# Patient Record
Sex: Female | Born: 1955 | Race: White | Hispanic: No | Smoking: Never smoker
Health system: Southern US, Community
[De-identification: ages and names within clinical notes are randomized; demographics above are authoritative.]

## PROBLEM LIST (undated history)

## (undated) DIAGNOSIS — K219 Gastro-esophageal reflux disease without esophagitis: Secondary | ICD-10-CM

## (undated) DIAGNOSIS — J189 Pneumonia, unspecified organism: Secondary | ICD-10-CM

## (undated) DIAGNOSIS — Q893 Situs inversus: Secondary | ICD-10-CM

## (undated) DIAGNOSIS — G709 Myoneural disorder, unspecified: Secondary | ICD-10-CM

## (undated) DIAGNOSIS — F419 Anxiety disorder, unspecified: Secondary | ICD-10-CM

## (undated) DIAGNOSIS — M1990 Unspecified osteoarthritis, unspecified site: Secondary | ICD-10-CM

## (undated) DIAGNOSIS — I10 Essential (primary) hypertension: Secondary | ICD-10-CM

## (undated) HISTORY — PX: NASAL SINUS SURGERY: SHX719

## (undated) HISTORY — PX: CARDIAC CATHETERIZATION: SHX172

---

## 2003-01-22 ENCOUNTER — Ambulatory Visit (HOSPITAL_COMMUNITY): Admission: RE | Admit: 2003-01-22 | Discharge: 2003-01-22 | Payer: Self-pay | Admitting: Neurosurgery

## 2003-01-22 IMAGING — XA IR FLUORO GUIDE NDL PLMT / BX
1 series · 13 of 16 positions shown · non-contrast
Comparison: none

CLINICAL DATA: Patient with low back pain.  Suspicion of diskitis at L3-4. 
 FLUOROSCOPICALLY GUIDED NEEDLE PLACEMENT FOR DISK ASPIRATION AT L3-4   
 Following a full explanation of the procedure along with the potentially associated complications, an informed witnessed consent was obtained.  
 The patient was laid prone on the fluoroscopic table. 
 Using biplane intermittent fluoroscopy, the L3-4 disk space was identified.   
 The overlying skin was prepped and draped in the usual sterile fashion. 
 Using a right posterolateral approach, in a coaxial manner through an 18 gauge spinal needle, two passes were made with a 20 cm 22 gauge curved Chiba needle.  Two separate needles were utilized. 
 Having confirmed the position of tip within the disk space, aspirations were performed and sent for analysis.  There were no acute complications and the patient tolerated the procedure well.  
 Medications utilized:  Versed 2 mg IV, Fentanyl 50 mg IV. 
 IMPRESSION
 Status post fluoroscopic needle placement for disk aspiration at L3-4 for suspected diskitis.  
 Cc:  Dr. BRITHANY

[Series 1: run · 13 of 16 slices shown]
[im 1/16]
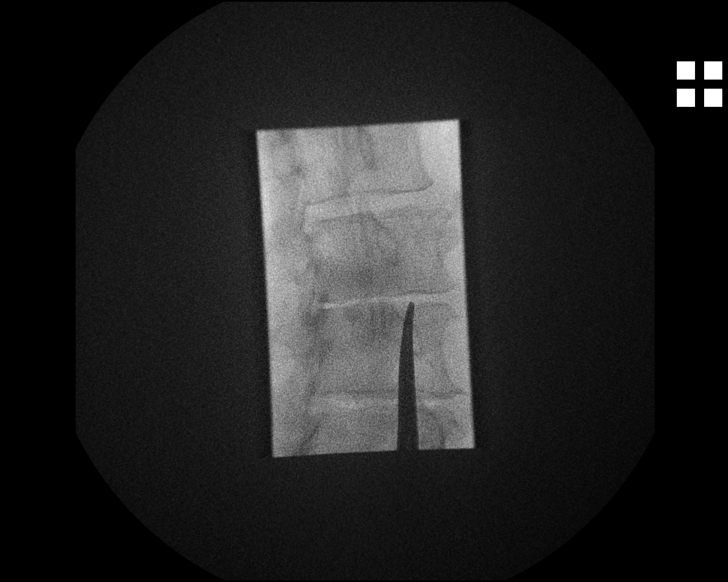
[im 2/16]
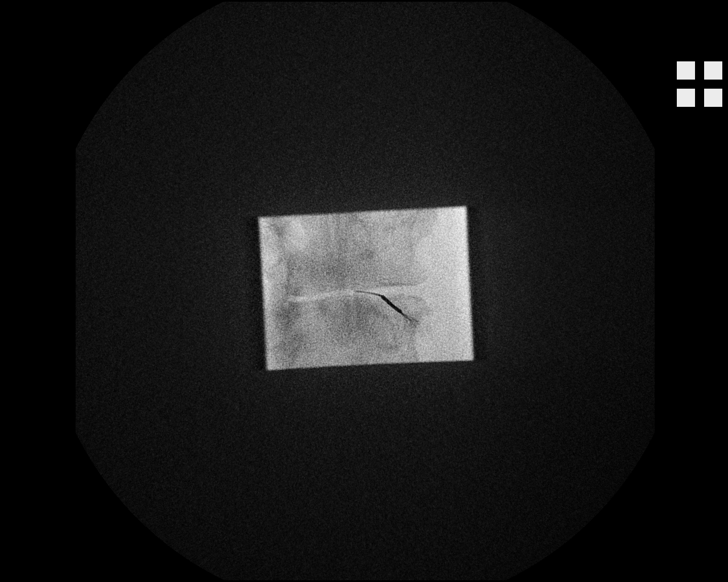
[im 4/16]
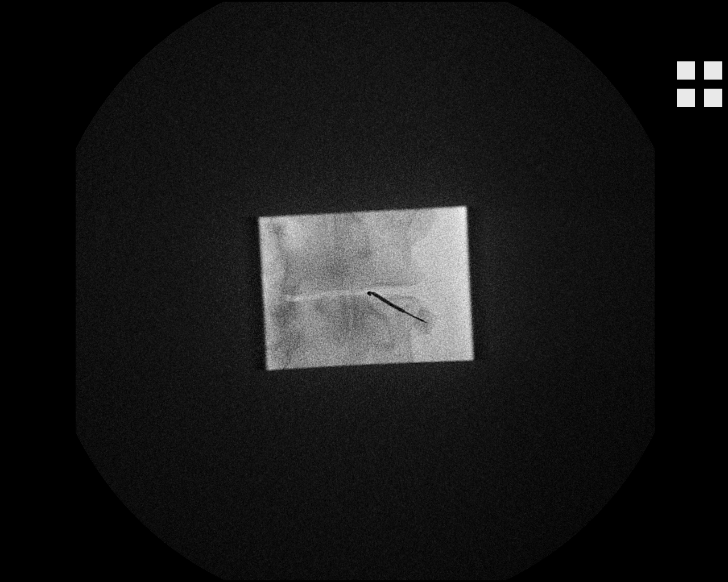
[im 5/16]
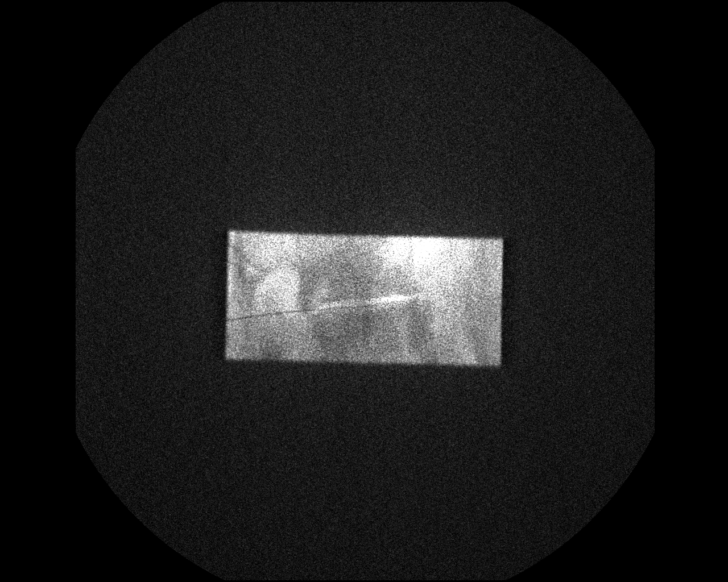
[im 6/16]
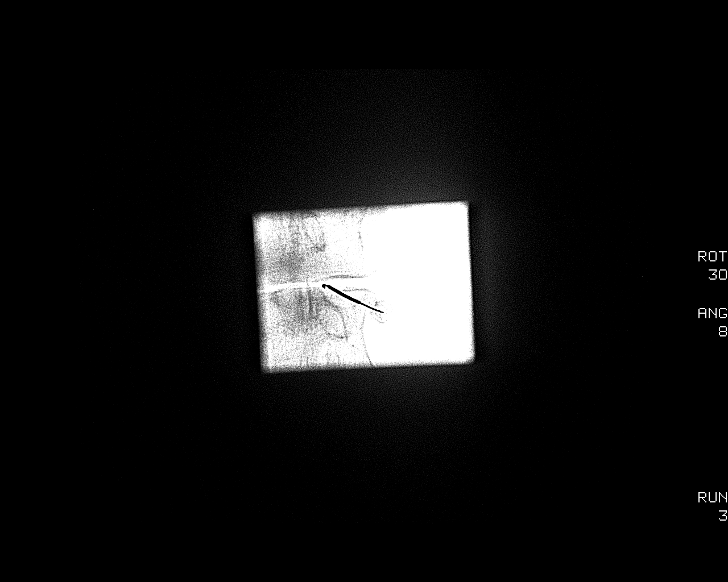
[im 7/16]
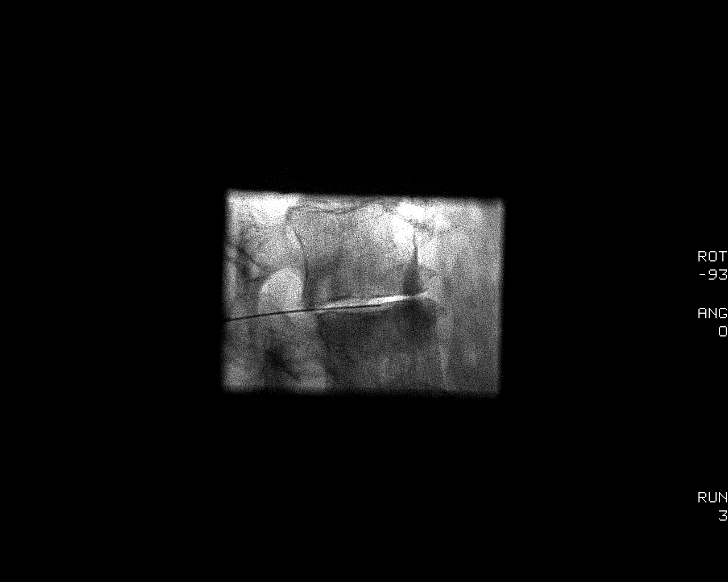
[im 9/16]
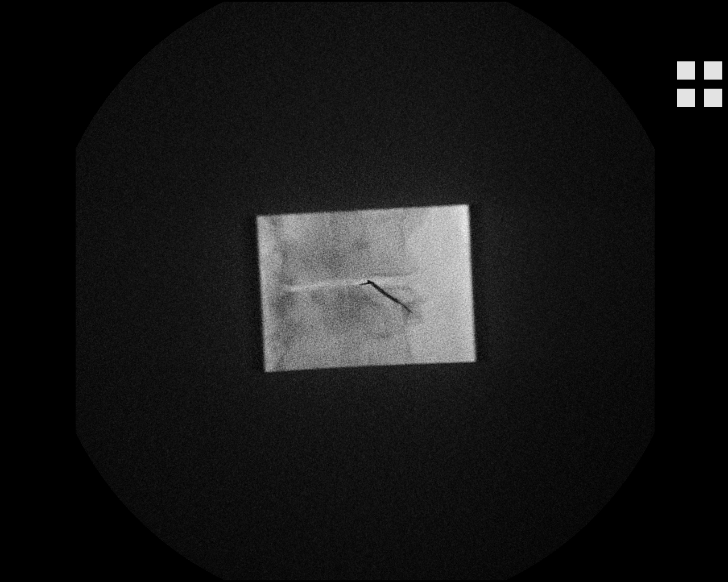
[im 10/16]
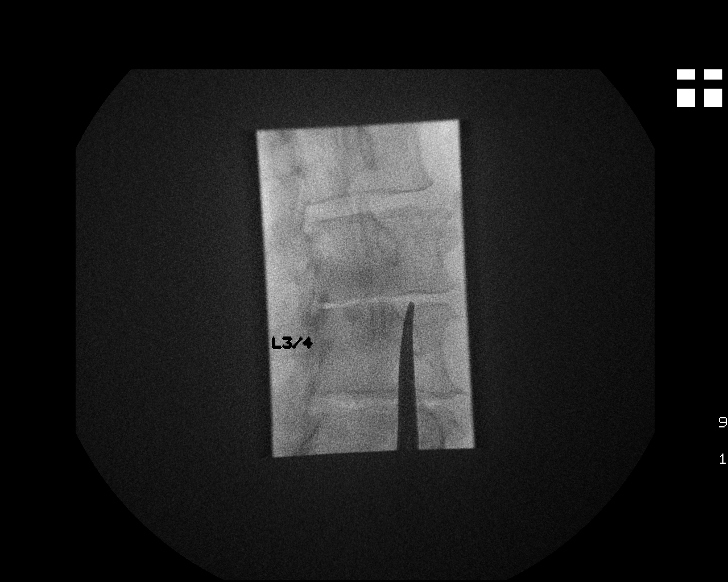
[im 11/16]
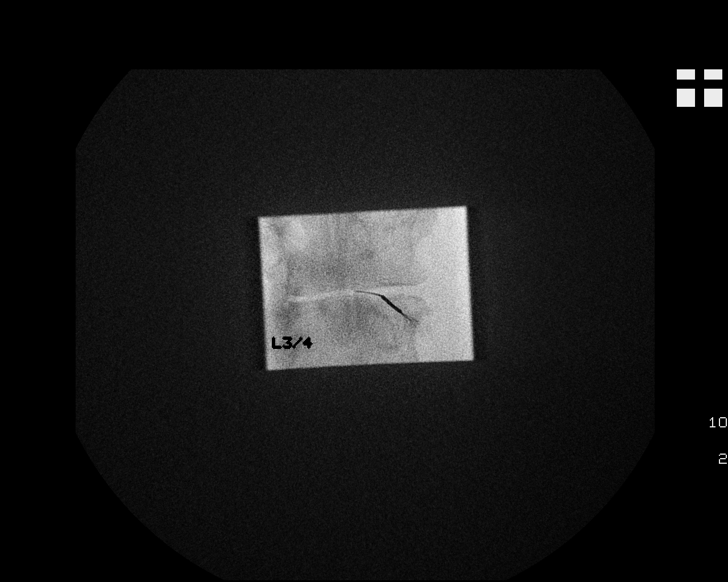
[im 12/16  full-range]
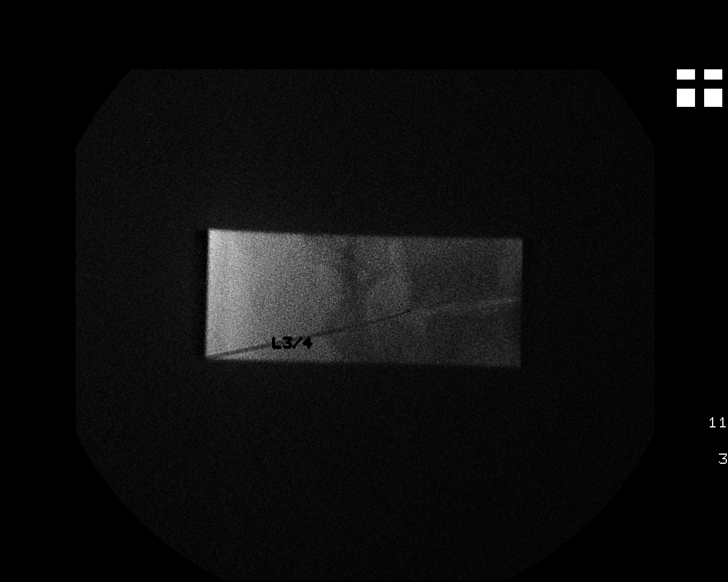
[im 13/16]
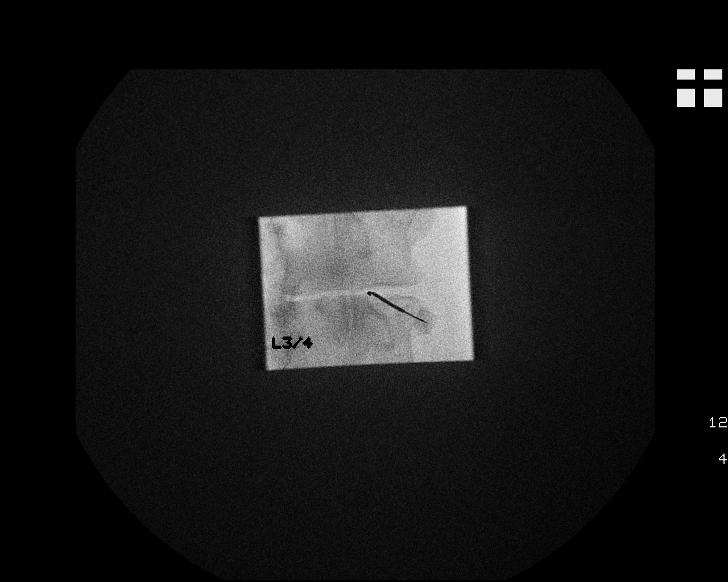
[im 15/16]
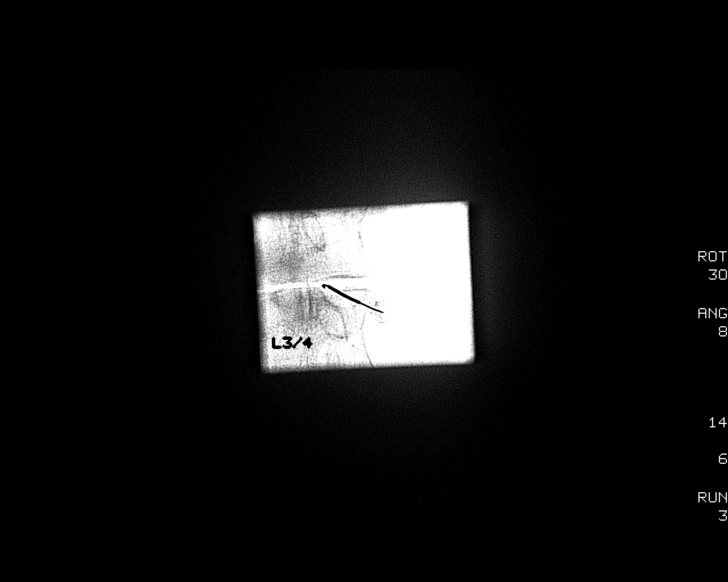
[im 16/16]
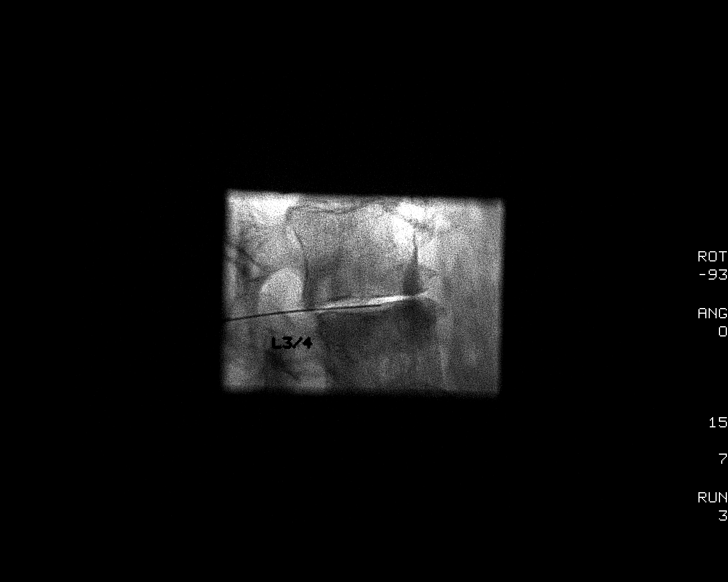

[13 of 16 positions shown; findings below may reference images not displayed]

## 2003-05-23 ENCOUNTER — Ambulatory Visit (HOSPITAL_COMMUNITY): Admission: RE | Admit: 2003-05-23 | Discharge: 2003-05-23 | Payer: Self-pay | Admitting: Neurosurgery

## 2003-05-23 IMAGING — XA IR FLUORO GUIDE NDL PLMT / BX
1 series · 13 of 19 positions shown · non-contrast
Comparison: none

CLINICAL DATA: Back pain.  Advanced degenerative disk disease at L3-4.  Recent positive TB test.  She has had a previous negative disk space aspiration at L3-4.
PERCUTANEOUS DISK AND BONE CORE BIOPSY AT L3-4 UNDER FLUOROSCOPY
TECHNIQUE: An appropriate skin entry site was determined under fluoroscopy.  The site was prepped with Betadine, draped in the usual sterile fashion, and infiltrated locally with 0.25% Bupivacaine.  Intravenous fentanyl and Versed were administered as conscious sedation during continues cardiorespiratory monitoring by radiology RN.  A 15 cm 13 gauge AZNAR bone biopsy needle was advanced to the margin of the L3-4 interspace using a right posterolateral approach.  The Cook needle was exchanged over a pin for the Kyphx bone biopsy trocar, which was advanced on into the interspace.  The fluted stylet was removed and the recovered disk and end-plate fragments were placed in saline.  Through the trochar, the coaxial bone biopsy device was advanced in three separate passes through both the interspace and end-plate.  Samples were placed in saline and sent for AFB and routine cultures, sensitivity, and gram stain, as well as AFB stain.  The guiding trocar was removed.  The patient tolerated the procedure well and there were no immediate complications.  
IMPRESSION
Technically successful percutaneous core biopsy of the L3-4 interspace and adjacent end-plates.

[Series 1: run · 13 of 19 slices shown]
[im 1/19]
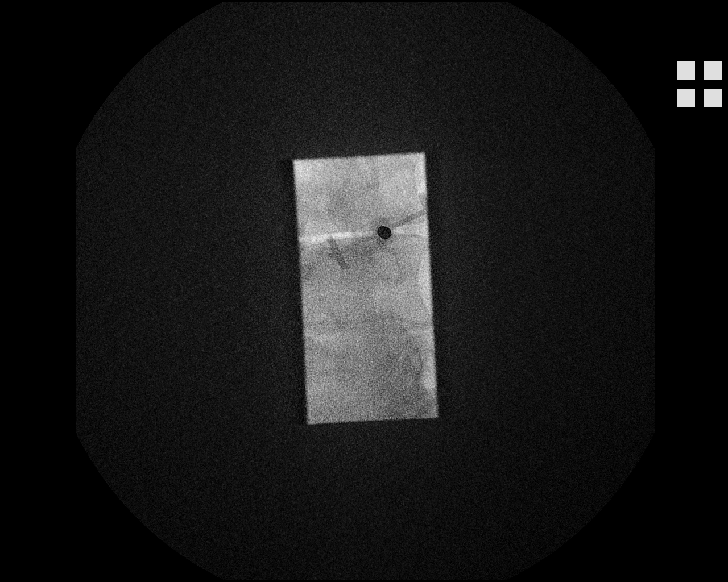
[im 3/19]
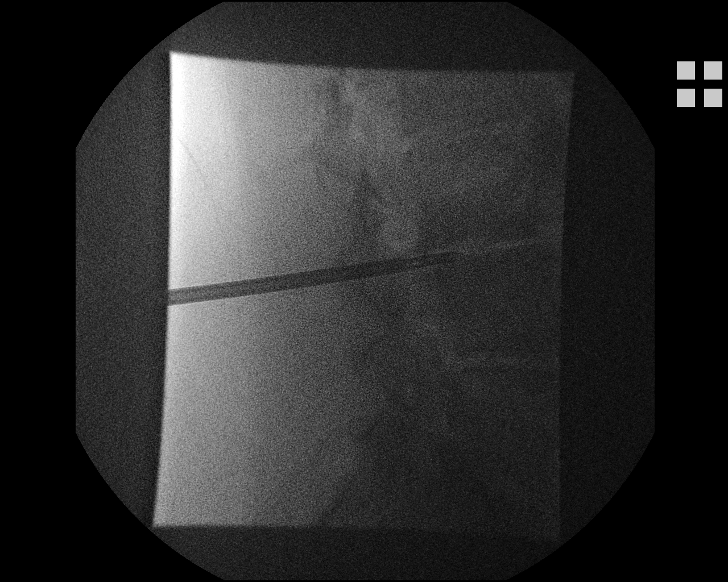
[im 4/19]
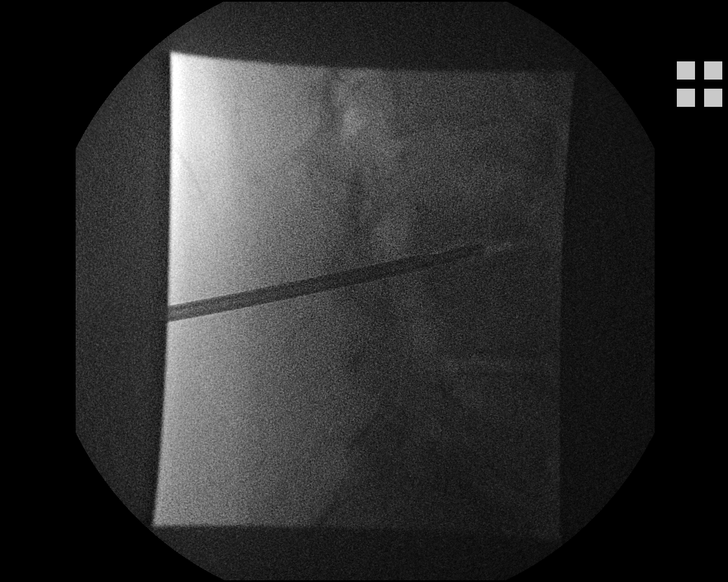
[im 6/19]
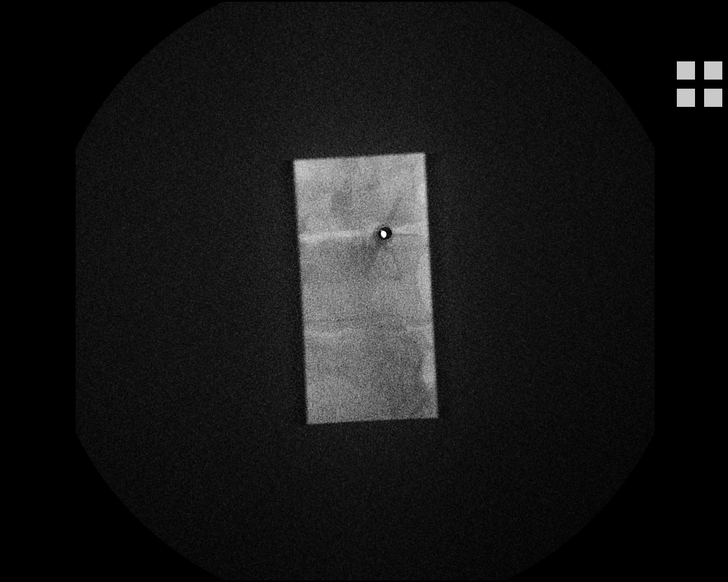
[im 7/19]
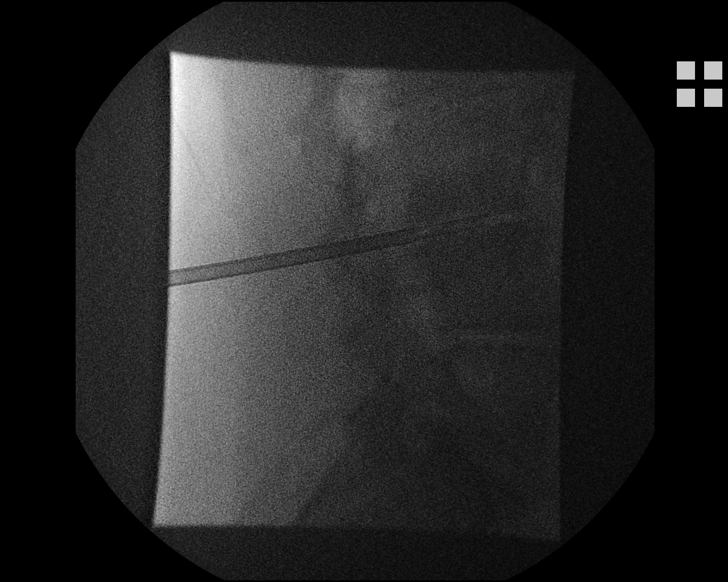
[im 9/19]
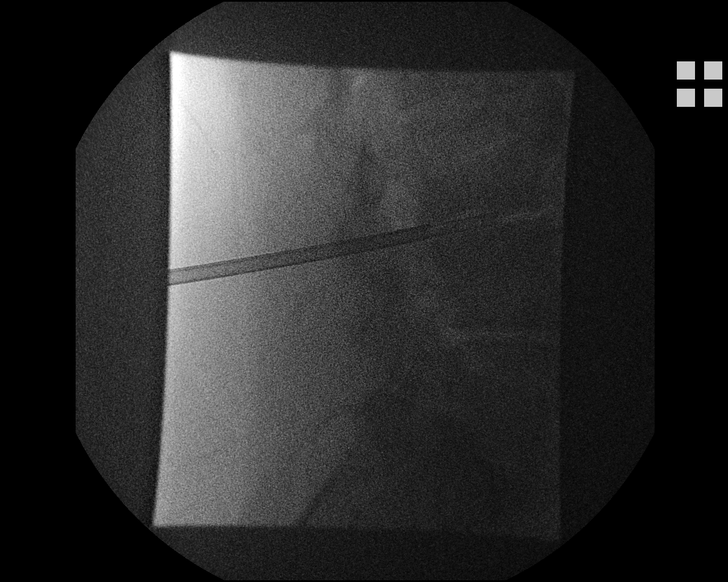
[im 10/19]
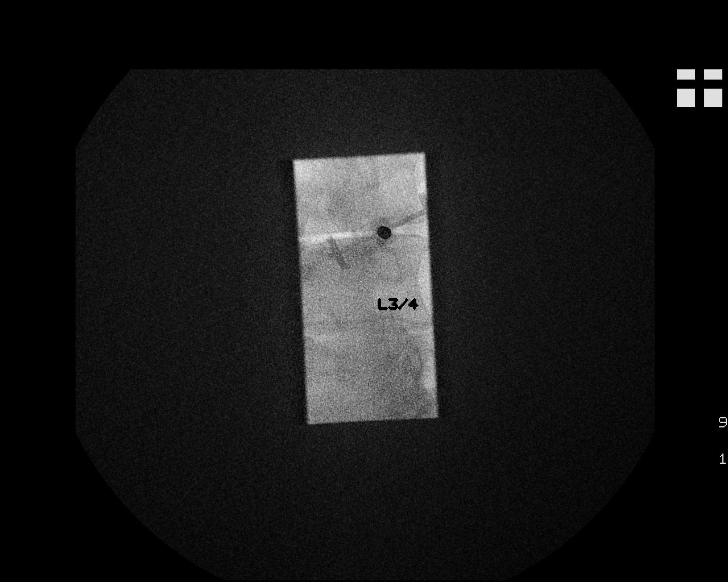
[im 11/19]
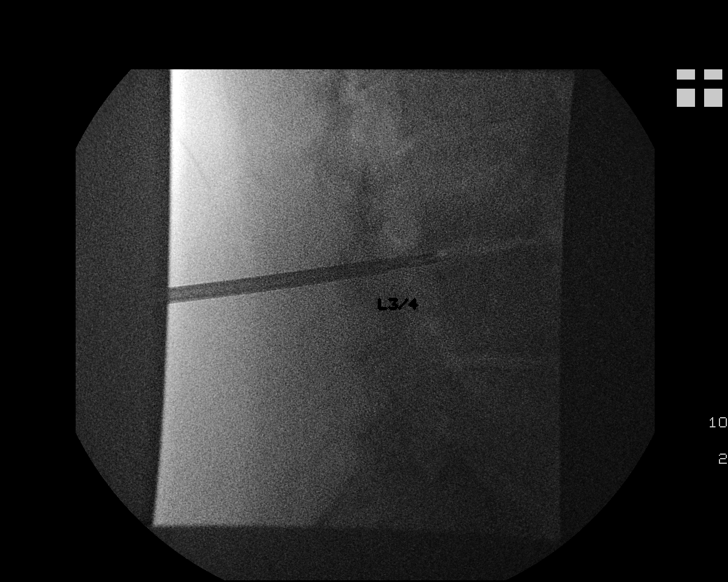
[im 13/19]
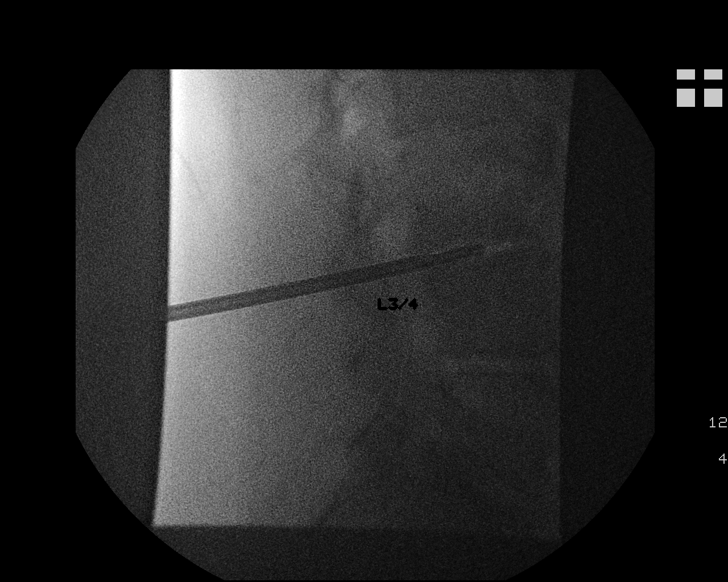
[im 14/19]
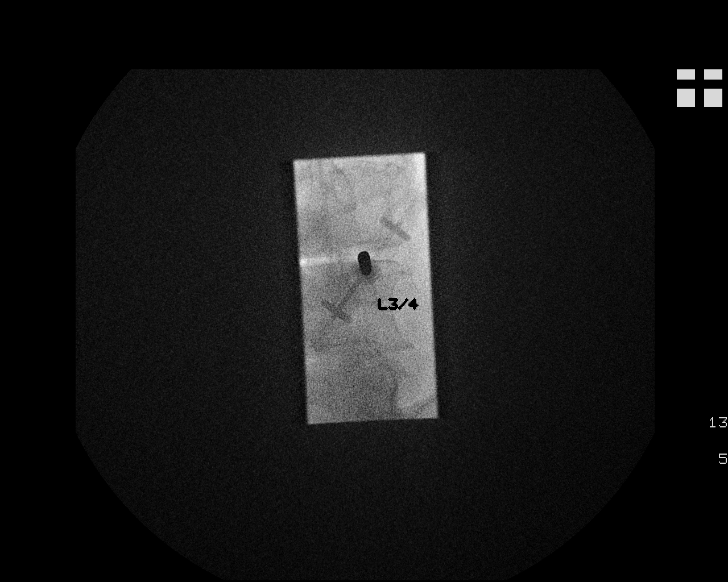
[im 16/19]
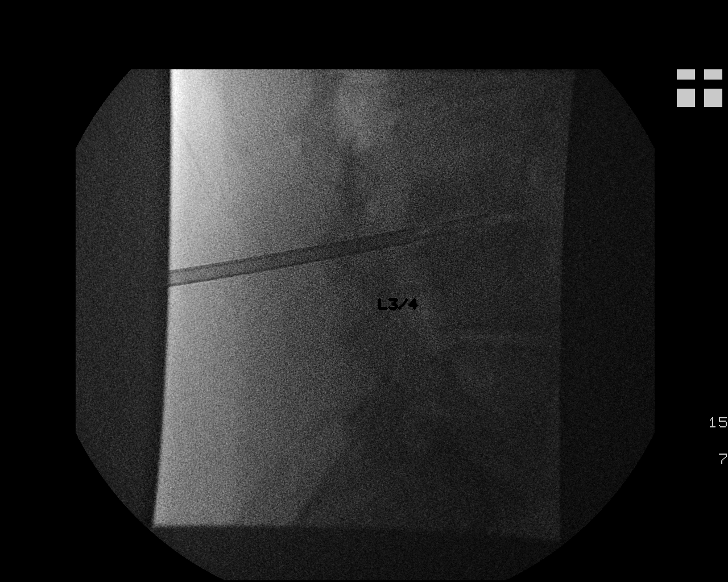
[im 17/19]
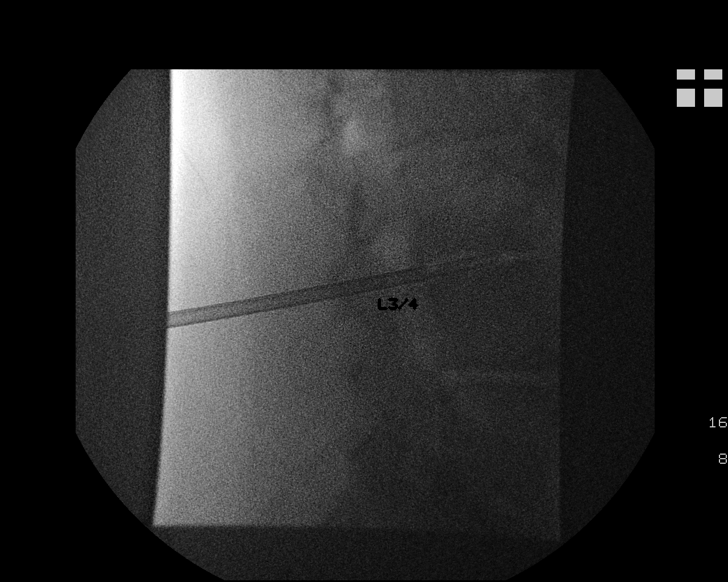
[im 19/19]
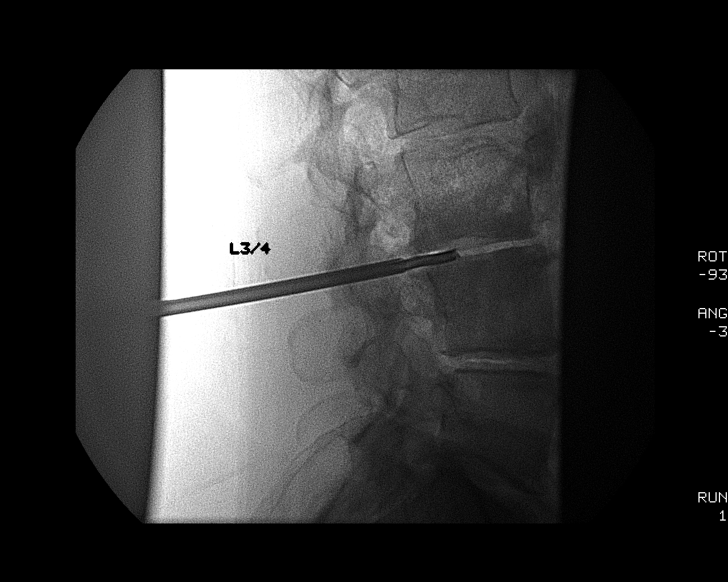

[13 of 19 positions shown; findings below may reference images not displayed]

## 2003-06-04 ENCOUNTER — Encounter: Admission: RE | Admit: 2003-06-04 | Discharge: 2003-06-04 | Payer: Self-pay | Admitting: Infectious Diseases

## 2003-07-18 ENCOUNTER — Encounter: Admission: RE | Admit: 2003-07-18 | Discharge: 2003-07-18 | Payer: Self-pay | Admitting: Infectious Diseases

## 2003-08-18 ENCOUNTER — Encounter: Admission: RE | Admit: 2003-08-18 | Discharge: 2003-08-18 | Payer: Self-pay | Admitting: Infectious Diseases

## 2006-03-16 ENCOUNTER — Inpatient Hospital Stay (HOSPITAL_COMMUNITY): Admission: EM | Admit: 2006-03-16 | Discharge: 2006-03-17 | Payer: Self-pay | Admitting: Emergency Medicine

## 2006-03-16 ENCOUNTER — Encounter: Payer: Self-pay | Admitting: Cardiology

## 2006-03-16 IMAGING — CR DG CHEST 2V
2 series · 2 of 2 positions shown · non-contrast
Comparison: None.

CLINICAL DATA: MI. Post-cardiac catheterization.

CHEST - 2 VIEW  [DATE]:

[view not recorded (1 of 2)]
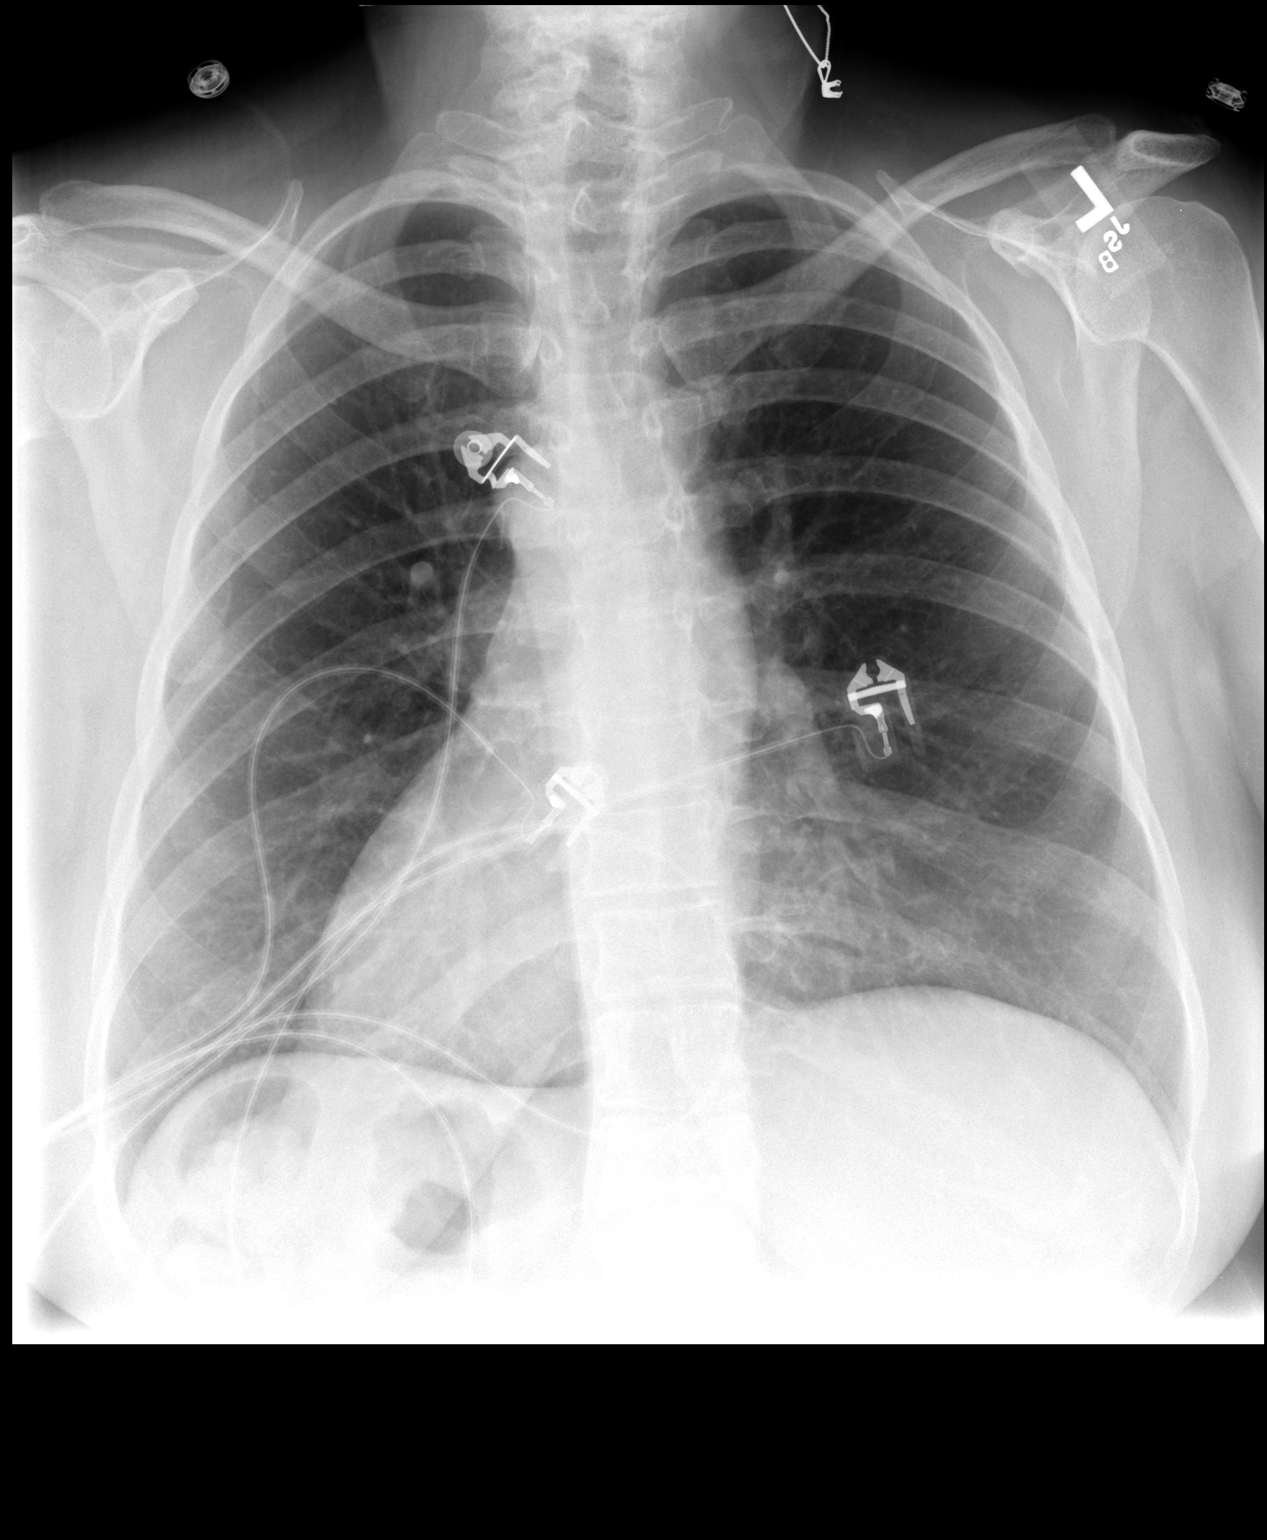

[view not recorded (2 of 2)]
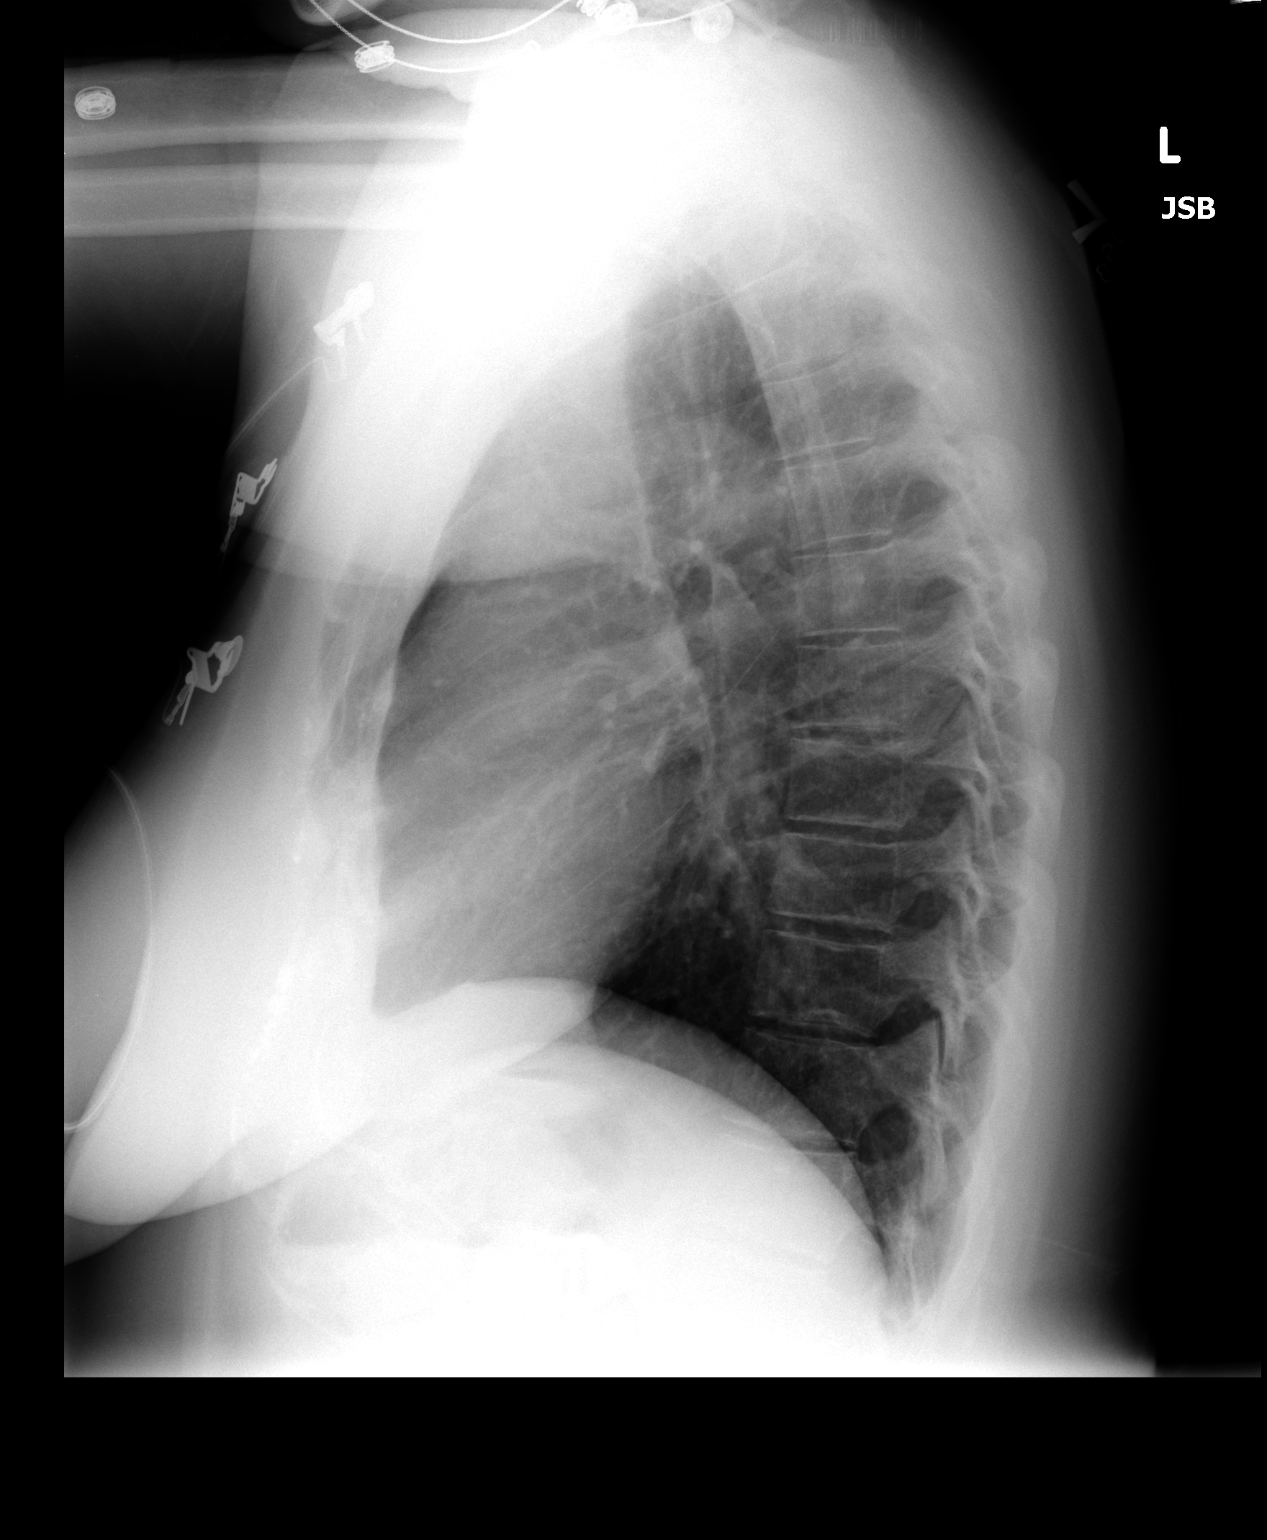

[2 of 2 positions shown; findings below may reference images not displayed]

FINDINGS: Situs inversus, with the heart on the right, the descending thoracic
aorta on the right, and the liver on the left. Heart size normal. Pulmonary
parenchyma clear. No pleural effusions. Visualized bony thorax intact.
IMPRESSION: Situs inversus. No acute cardiopulmonary disease.

## 2010-03-12 ENCOUNTER — Ambulatory Visit (INDEPENDENT_AMBULATORY_CARE_PROVIDER_SITE_OTHER): Payer: BC Managed Care – PPO | Admitting: Cardiology

## 2010-03-12 DIAGNOSIS — I10 Essential (primary) hypertension: Secondary | ICD-10-CM

## 2010-12-16 ENCOUNTER — Encounter (HOSPITAL_COMMUNITY): Payer: Self-pay | Admitting: Pharmacy Technician

## 2010-12-17 ENCOUNTER — Other Ambulatory Visit: Payer: Self-pay | Admitting: Urology

## 2010-12-22 ENCOUNTER — Encounter (HOSPITAL_COMMUNITY)
Admission: RE | Admit: 2010-12-22 | Discharge: 2010-12-22 | Disposition: A | Discharge status: Home / Self Care | Payer: BC Managed Care – PPO | Source: Ambulatory Visit | Attending: Obstetrics and Gynecology | Admitting: Obstetrics and Gynecology

## 2010-12-22 ENCOUNTER — Encounter (HOSPITAL_COMMUNITY): Payer: Self-pay

## 2010-12-22 HISTORY — DX: Myoneural disorder, unspecified: G70.9

## 2010-12-22 HISTORY — DX: Essential (primary) hypertension: I10

## 2010-12-22 LAB — BASIC METABOLIC PANEL
BUN: 20 mg/dL (ref 6–23)
CO2: 25 mEq/L (ref 19–32)
Calcium: 10.3 mg/dL (ref 8.4–10.5)
Chloride: 102 mEq/L (ref 96–112)
Creatinine, Ser: 0.72 mg/dL (ref 0.50–1.10)
GFR calc Af Amer: >90 mL/min (ref >90)
GFR calc non Af Amer: >90 mL/min (ref >90)
Glucose, Bld: 89 mg/dL (ref 70–99)
Potassium: 3.7 mEq/L (ref 3.5–5.1)
Sodium: 137 mEq/L (ref 135–145)

## 2010-12-22 LAB — CBC
HCT: 41.7 % (ref 36.0–46.0)
Hemoglobin: 14.4 g/dL (ref 12.0–15.0)
MCH: 29.4 pg (ref 26.0–34.0)
MCHC: 34.5 g/dL (ref 30.0–36.0)
MCV: 85.1 fL (ref 78.0–100.0)
Platelets: 273 10*3/uL (ref 150–400)
RBC: 4.9 MIL/uL (ref 3.87–5.11)
RDW: 14.7 % (ref 11.5–15.5)
WBC: 9.3 10*3/uL (ref 4.0–10.5)

## 2010-12-22 LAB — SURGICAL PCR SCREEN
MRSA, PCR: NEGATIVE
Staphylococcus aureus: NEGATIVE

## 2010-12-22 NOTE — Pre-Procedure Instructions (Signed)
Patient states her heart is on the right side of her chest and all her organs are reversed from normal position.

## 2010-12-22 NOTE — Patient Instructions (Signed)
YOUR PROCEDURE IS SCHEDULED ON:01/04/11  ENTER THROUGH THE MAIN ENTRANCE OF [facility] AT:6am  USE DESK PHONE AND DIAL [id number] TO INFORM us OF YOUR ARRIVAL  CALL [phone] IF YOU HAVE ANY QUESTIONS OR PROBLEMS PRIOR TO YOUR ARRIVAL.  REMEMBER: DO NOT EAT OR DRINK AFTER MIDNIGHT :Monday  SPECIAL INSTRUCTIONS:   YOU MAY BRUSH YOUR TEETH THE MORNING OF SURGERY   TAKE THESE MEDICINES THE DAY OF SURGERY WITH SIP OF WATER: BP pill   DO NOT WEAR JEWELRY, EYE MAKEUP, LIPSTICK OR DARK FINGERNAIL POLISH DO NOT WEAR LOTIONS OR DEODORANT DO NOT SHAVE FOR 48 HOURS PRIOR TO SURGERY  YOU WILL NOT BE ALLOWED TO DRIVE YOURSELF HOME.  NAME OF DRIVER: Hailey Bates

## 2010-12-28 NOTE — H&P (Signed)
Hailey Bates is an 55 y.o. female.   Chief Complaint: "my bladder has dropped"  HPI: DWF G2P1 with uterine decensus, and cystocele, symptomatic, for repair.   Past Medical History  Diagnosis Date  . Hypertension   . Neuromuscular disorder     sciatica  Situs inversus  Past Surgical History  Procedure Date  . Cardiac catheterization   . Nasal sinus surgery     No family history on file. Social History:  reports that she has never smoked. She does not have any smokeless tobacco history on file. She reports that she drinks alcohol. She reports that she does not use illicit drugs.  Allergies: No Known Allergies  No current facility-administered medications on file as of .   Medications Prior to Admission  Medication Sig Dispense Refill  . amitriptyline (ELAVIL) 25 MG tablet Take 25 mg by mouth at bedtime.        Marland Kitchen amLODipine-olmesartan (AZOR) 5-20 MG per tablet Take 1 tablet by mouth daily.        . cyclobenzaprine (FLEXERIL) 10 MG tablet Take 10 mg by mouth daily as needed. Takes for muscle spasms       . HYDROcodone-acetaminophen (NORCO) 5-325 MG per tablet Take 1 tablet by mouth every 6 (six) hours as needed. Takes for pain        . zolpidem (AMBIEN) 10 MG tablet Take 10 mg by mouth at bedtime.        . naproxen (NAPROSYN) 500 MG tablet Take 500 mg by mouth daily.          No results found for this or any previous visit (from the past 48 hour(s)). No results found.  Review of Systems  All other systems reviewed and are negative.    There were no vitals taken for this visit. Physical Exam  Constitutional: She is oriented to person, place, and time. She appears well-developed and well-nourished.  HENT:  Head: Normocephalic and atraumatic.  Eyes: Conjunctivae and EOM are normal. Pupils are equal, round, and reactive to light.  Neck: Normal range of motion.  Cardiovascular: Normal rate and regular rhythm.   Respiratory: Effort normal and breath sounds normal.  GI:  Soft. Bowel sounds are normal.  Genitourinary:       Grade 2 - 3 cystocele, min rectocele; uterus desends to 3 cm inside introitus.  Uterus normal size, NT.  Adnexa neg.  Musculoskeletal: Normal range of motion.  Neurological: She is alert and oriented to person, place, and time.  Skin: Skin is warm and dry.  Psychiatric: She has a normal mood and affect.     Assessment/Plan Pelvic prolapse.  For TVH by Dr. Tresa Res, and pelvic reconstruction by Dr. Lorin Picket McDiarmid.  Moe Brier P 12/28/2010, 2:35 PM

## 2011-01-02 NOTE — H&P (Signed)
History of Present Illness   I was consulted by Dr. Arline Asp Romine regarding Ms. Hailey Bates's voiding dysfunction and pelvic organ prolapse. She is scheduled for a hysterectomy and prolapse repair on December 4.   Ms. Santarelli is 55 years of age. She sometimes leaks with coughing and sneezing but not bending and lifting. She rarely leaks with urgency. She denies enuresis. She wears one liner a day for safety.  She voids every 2 hours but drinks a lot of fluids. She gets up once or twice a night. She reports a good flow.   She denies a history of kidney stones, previous GU surgery and urinary tract infections.  She can feel vaginal bulging. It feels like she is wearing a tampon. She has not reduced it. She has not had a hysterectomy. She has no neurologic risk factors or symptoms. Her bowel movements are normal. Her symptoms have not been medically treated.    Past Medical History Problems  1. History of  Arthritis V13.4 2. History of  Hypertension 401.9  Surgical History Problems  1. History of  Heart Surgery 2. History of  Heart Surgery 3. History of  Sinus Surgery 4. History of  Sinus Surgery  Current Meds 1. Amitriptyline HCl 25 MG Oral Tablet; Therapy: (Recorded:16Nov2012) to 2. Azor 5-20 MG Oral Tablet; Therapy: 15Nov2012 to 3. Cyclobenzaprine HCl 10 MG Oral Tablet; Therapy: (Recorded:16Nov2012) to 4. Oxycodone-Acetaminophen 5-325 MG Oral Tablet; Therapy: (Recorded:16Nov2012) to 5. Zolpidem Tartrate 10 MG Oral Tablet; Therapy: (Recorded:16Nov2012) to  Allergies Medication  1. No Known Drug Allergies  Family History Problems  1. Paternal history of  Acute Myocardial Infarction V17.3 2. Fraternal history of  Bladder Cancer V16.52 3. Family history of  Death In The Family Father 4. Paternal history of  Death In The Family Father father passed @ age 84heart attack 5. Family history of  Death In The Family Mother 6. Maternal history of  Death In The Family  Mother mother passed @ age 84cancer 7. Paternal history of  Diabetes Mellitus V18.0 8. Family history of  Family Health Status Number Of Children 1 daughter 27. Paternal history of  Family Health Status Number Of Children 1 daughter 67. Paternal history of  Hypertension V17.49 11. Maternal history of  Hypertension V17.49 12. Maternal history of  Neoplasm Of The Brain  Social History Problems    Alcohol Use occasional wine   Caffeine Use 3 glasses daily   Marital History - Divorced V61.03   Never A Smoker   Occupation: Geologist, engineering  Review of Systems Constitutional, skin, eye, otolaryngeal, hematologic/lymphatic, cardiovascular, pulmonary, endocrine, musculoskeletal, gastrointestinal, neurological and psychiatric system(s) were reviewed and pertinent findings if present are noted.  Genitourinary: incontinence.    Vitals Vital Signs [Data Includes: Last 1 Day]  16Nov2012 09:58AM  BMI Calculated: 27.32 BSA Calculated: 1.78 Height: 5 ft 4 in Weight: 160 lb  Blood Pressure: 131 / 78 Temperature: 98.1 F Heart Rate: 91  Physical Exam Constitutional: Well nourished and well developed . No acute distress.  ENT:. The ears and nose are normal in appearance.  Neck: The appearance of the neck is normal and no neck mass is present.  Pulmonary: No respiratory distress and normal respiratory rhythm and effort.  Cardiovascular: Heart rate and rhythm are normal . No peripheral edema.  Abdomen: The abdomen is soft and nontender. No masses are palpated. No CVA tenderness. No hernias are palpable. No hepatosplenomegaly noted.  Lymphatics: The femoral and inguinal nodes are not enlarged or tender.  Skin: Normal skin turgor, no visible rash and no visible skin lesions.  Neuro/Psych:. Mood and affect are appropriate.   . Genitourinary: Ms. Soave on pelvic examination had a grade 3 cystocele that reached the introitus.  She had a moderate to large central defect. Her cervix  descended approximately 4-5 cm.  With the prolapse reduced she had minimal rectocele.  With the cystocele reduced after cystoscopy she had no stress incontinence.    Results/Data    Today she underwent a number of tests which I personally reviewed. Urinalysis: Negative.  Uroflowmetry: She voided 126 mL with a maximum flow of 18 mL per second with a normal pattern.  Bladder scan: Bladder scan residual was 18 mL.   Cystoscopy: Today the patient underwent cystoscopy after discussing pros, cons and risks. The procedure was performed to assess the bladder/urethra and to rule out an intravesical cause of their symptoms. A well lubricated, sterile cystoscope was utilized and gently inserted into the urethra. The bladder mucosa and trigone were normal. There was no stitch, foreign body, or carcinoma. There was clear urine effluxing from both ureters. The procedure was well tolerated.   On urodynamics Ms. Baltz did not void and was catheterized for 75 mL. Her maximum capacity was 432 mL. Her bladder was unstable reaching pressures of 18 cm of water but she did not leak. Importantly, she did not leak with a Valsalva leak-point pressure of 117 cm of water. During voluntary voiding she voided 432 mL with a maximum void of 20 mL per second. Maximum voiding pressure is 12 cm of water. Residual is 0.0 mL. EMG activity was normal. She had an impressive cystocele with hypermobility fluoroscopically. The details of the urodynamics are signed and dictated on the urodynamic sheet. Urine [Data Includes: Last 1 Day]  16Nov2012  COLOR: YELLOW  Reference Range YELLOW APPEARANCE: CLEAR  Reference Range CLEAR SPECIFIC GRAVITY: 1.015  Reference Range 1.005-1.030 pH: 6.5  Reference Range 5.0-8.0 GLUCOSE: NEG mg/dL Reference Range NEG BILIRUBIN: NEG  Reference Range NEG KETONE: NEG mg/dL Reference Range NEG BLOOD: NEG  Reference Range NEG PROTEIN: NEG mg/dL Reference Range NEG UROBILINOGEN: 0.2 mg/dL Reference  Range 4.0-9.8 NITRITE: NEG  Reference Range NEG LEUKOCYTE ESTERASE: NEG  Reference Range NEG  Assessment Assessed  1. Feelings Of Urinary Urgency 788.63 2. Cystocele 596.89 3. Uterovaginal Prolapse 618.4  Plan  Feelings Of Urinary Urgency (788.63)  1. Complex Uroflowmetry  Done: 16Nov2012 2. 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 She has a moderate grade 3 cystocele. She has mild stress urinary incontinence. She has mild frequency and nocturia. She is going to have urodynamics today. ____She was rescheduled in December. In my opinion she likely best be served with a transvaginal hysterectomy with vault suspension, cystocele repair and graft. We will discuss potential sling options following her urodynamics. We will proceed accordingly.   I drew Ms. Plancarte a picture. We talked about watchful waiting versus pessary versus prolapse surgery.  I drew her a picture and we talked about prolapse surgery in detail. Pros, cons, general surgical and anesthetic risks, and other options including behavioral therapy, pessaries, and watchful waiting were discussed. She understands that prolapse repairs are successful in 80-85% of cases for prolapse symptoms and can recur anteriorly, posteriorly, and/or apically. She understands that in most cases I use a graft and general risks were discussed. Surgical risks were described but not limited to the discussion of injury to neighboring structures including the  bowel (with possible life-threatening sepsis and colostomy), bladder, urethra, vagina (all resulting in further surgery), and ureter (resulting in re-implantation). We talked about injury to nerves/soft tissue leading to debilitating and intractable pelvic, abdominal, and lower extremity pain syndromes and neuropathies. The risks of buttock pain, intractable dyspareunia, and vaginal narrowing and shortening with sequelae were discussed. Bleeding  risks, transfusion rates, and infection were discussed. The risk of persistent, de novo, or worsening bladder and/or bowel incontinence/dysfunction was discussed. The need for CIC was described as well the usual post-operative course. The patient understands that she might not reach her treatment goal and that she might be worse following surgery.  We talked about a sling.  We talked about a sling in detail. Pros, cons, general surgical and anesthetic risks, and other options including behavioral therapy and watchful waiting were discussed. She understands that slings are generally successful in 90% of cases for stress incontinence, 50% for urge incontinence, and that in a small percentage of cases the incontinence can worsen. The risk of persistent, de novo, or worsening incontinence/dysfunction was discussed. Risks were described but not limited to the discussion of injury to neighboring structures including the bowel (with possible life-threatening sepsis and colostomy), bladder, urethra, vagina (all resulting in further surgery), and ureter (resulting in re-implantation). We also talked about the risk of retention requiring urethrolysis, extrusion requiring revision, and erosion resulting in further surgery. Bleeding risks and transfusion rates and the risk of infection were discussed. The risk of pelvic and abdominal pain syndromes, dyspareunia, and neuropathies were discussed. The need for CIC was described as well the usual post-operative course. The patient understands that she might not reach her treatment goal and that she might be worse following surgery.  I think she would be best served with a transvaginal hysterectomy, vault suspension, cystocele repair and graft.   Ms. Ridgely and I both agree that we would not do a sling and we would send her to physical therapy if she were not having surgery, but after a lengthy discussion she would like to have a sling simultaneously to see if she can  achieve dryness for her mild stress incontinence as well as to be helped by her prolapse surgery.  I am going to send a copy of my note to Dr. Arline Asp Romine to keep her updated on her treatment course.  After a thorough review of the management options for the patient's condition the patient  elected to proceed with surgical therapy as noted above. We have discussed the potential benefits and risks of the procedure, side effects of the proposed treatment, the likelihood of the patient achieving the goals of the procedure, and any potential problems that might occur during the procedure or recuperation. Informed consent has been obtained.

## 2011-01-03 MED ORDER — DEXTROSE 5 % IV SOLN
2.0000 g | INTRAVENOUS | Status: AC
  Administered 2011-01-04: 2 g via INTRAVENOUS
  Filled 2011-01-03: qty 2

## 2011-01-04 ENCOUNTER — Ambulatory Visit (HOSPITAL_COMMUNITY): Payer: BC Managed Care – PPO | Admitting: Anesthesiology

## 2011-01-04 ENCOUNTER — Encounter (HOSPITAL_COMMUNITY): Payer: Self-pay | Admitting: *Deleted

## 2011-01-04 ENCOUNTER — Encounter (HOSPITAL_COMMUNITY)
Admission: RE | Disposition: A | Discharge status: Home / Self Care | Payer: Self-pay | Source: Ambulatory Visit | Attending: Obstetrics and Gynecology

## 2011-01-04 ENCOUNTER — Encounter (HOSPITAL_COMMUNITY): Payer: Self-pay | Admitting: Anesthesiology

## 2011-01-04 ENCOUNTER — Other Ambulatory Visit: Payer: Self-pay | Admitting: Obstetrics and Gynecology

## 2011-01-04 ENCOUNTER — Ambulatory Visit (HOSPITAL_COMMUNITY)
Admission: RE | Admit: 2011-01-04 | Discharge: 2011-01-05 | Disposition: A | Discharge status: Home / Self Care | Payer: BC Managed Care – PPO | Source: Ambulatory Visit | Attending: Obstetrics and Gynecology | Admitting: Obstetrics and Gynecology

## 2011-01-04 DIAGNOSIS — Z01818 Encounter for other preprocedural examination: Secondary | ICD-10-CM | POA: Insufficient documentation

## 2011-01-04 DIAGNOSIS — N812 Incomplete uterovaginal prolapse: Secondary | ICD-10-CM | POA: Insufficient documentation

## 2011-01-04 DIAGNOSIS — Z01812 Encounter for preprocedural laboratory examination: Secondary | ICD-10-CM | POA: Insufficient documentation

## 2011-01-04 DIAGNOSIS — D251 Intramural leiomyoma of uterus: Secondary | ICD-10-CM | POA: Insufficient documentation

## 2011-01-04 DIAGNOSIS — N393 Stress incontinence (female) (male): Secondary | ICD-10-CM | POA: Insufficient documentation

## 2011-01-04 HISTORY — PX: CYSTOSCOPY: SHX5120

## 2011-01-04 HISTORY — PX: BLADDER SUSPENSION: SHX72

## 2011-01-04 HISTORY — PX: ANTERIOR AND POSTERIOR REPAIR: SHX5121

## 2011-01-04 HISTORY — PX: VAGINAL HYSTERECTOMY: SHX2639

## 2011-01-04 HISTORY — PX: VAGINAL PROLAPSE REPAIR: SHX830

## 2011-01-04 SURGERY — HYSTERECTOMY, VAGINAL
Anesthesia: General | Site: Vagina | Wound class: Clean Contaminated

## 2011-01-04 MED ORDER — LIDOCAINE-EPINEPHRINE (PF) 1 %-1:200000 IJ SOLN
INTRAMUSCULAR | Status: DC | PRN
  Administered 2011-01-04: 24 mL

## 2011-01-04 MED ORDER — NEOSTIGMINE METHYLSULFATE 1 MG/ML IJ SOLN
INTRAMUSCULAR | Status: DC | PRN
  Administered 2011-01-04: 5 mg via INTRAVENOUS

## 2011-01-04 MED ORDER — MEPERIDINE HCL 25 MG/ML IJ SOLN
INTRAMUSCULAR | Status: AC
  Administered 2011-01-04: 25 mg
  Filled 2011-01-04: qty 1

## 2011-01-04 MED ORDER — ROCURONIUM BROMIDE 50 MG/5ML IV SOLN
INTRAVENOUS | Status: AC
  Filled 2011-01-04: qty 1

## 2011-01-04 MED ORDER — GLYCOPYRROLATE 0.2 MG/ML IJ SOLN
INTRAMUSCULAR | Status: AC
  Filled 2011-01-04: qty 3

## 2011-01-04 MED ORDER — HYDROMORPHONE HCL PF 1 MG/ML IJ SOLN
INTRAMUSCULAR | Status: AC
  Administered 2011-01-04: 0.5 mg via INTRAVENOUS
  Filled 2011-01-04: qty 1

## 2011-01-04 MED ORDER — STERILE WATER FOR IRRIGATION IR SOLN
Status: DC | PRN
  Administered 2011-01-04: 3000 mL

## 2011-01-04 MED ORDER — FENTANYL CITRATE 0.05 MG/ML IJ SOLN
INTRAMUSCULAR | Status: DC | PRN
  Administered 2011-01-04 (×7): 50 ug via INTRAVENOUS

## 2011-01-04 MED ORDER — INDIGOTINDISULFONATE SODIUM 8 MG/ML IJ SOLN
5.0000 mL | Freq: Once | INTRAMUSCULAR | Status: AC
  Administered 2011-01-04: 5 mL via INTRAVENOUS
  Filled 2011-01-04: qty 5

## 2011-01-04 MED ORDER — PROMETHAZINE HCL 25 MG/ML IJ SOLN: 12.5000 mg | INTRAMUSCULAR | Status: DC | PRN

## 2011-01-04 MED ORDER — DEXTROSE IN LACTATED RINGERS 5 % IV SOLN
INTRAVENOUS | Status: DC
  Administered 2011-01-04 – 2011-01-05 (×2): via INTRAVENOUS

## 2011-01-04 MED ORDER — PHENYLEPHRINE HCL 10 MG/ML IJ SOLN
INTRAMUSCULAR | Status: DC | PRN
  Administered 2011-01-04: .04 mg via INTRAVENOUS
  Administered 2011-01-04: .4 mg via INTRAVENOUS
  Administered 2011-01-04: .04 mg via INTRAVENOUS

## 2011-01-04 MED ORDER — KETOROLAC TROMETHAMINE 30 MG/ML IJ SOLN
15.0000 mg | Freq: Once | INTRAMUSCULAR | Status: AC | PRN
  Administered 2011-01-04: 30 mg via INTRAVENOUS
  Filled 2011-01-04: qty 1

## 2011-01-04 MED ORDER — MIDAZOLAM HCL 5 MG/5ML IJ SOLN
INTRAMUSCULAR | Status: DC | PRN
  Administered 2011-01-04: 2 mg via INTRAVENOUS

## 2011-01-04 MED ORDER — FENTANYL CITRATE 0.05 MG/ML IJ SOLN
INTRAMUSCULAR | Status: AC
  Filled 2011-01-04: qty 2

## 2011-01-04 MED ORDER — MEPERIDINE HCL 25 MG/ML IJ SOLN: 25.0000 mg | Freq: Once | INTRAMUSCULAR | Status: DC

## 2011-01-04 MED ORDER — SODIUM CHLORIDE 0.9 % IR SOLN
Freq: Once | Status: DC
  Filled 2011-01-04: qty 500000

## 2011-01-04 MED ORDER — ROCURONIUM BROMIDE 100 MG/10ML IV SOLN
INTRAVENOUS | Status: DC | PRN
  Administered 2011-01-04 (×3): 5 mg via INTRAVENOUS
  Administered 2011-01-04: 40 mg via INTRAVENOUS
  Administered 2011-01-04: 10 mg via INTRAVENOUS
  Administered 2011-01-04: 15 mg via INTRAVENOUS
  Administered 2011-01-04: 20 mg via INTRAVENOUS

## 2011-01-04 MED ORDER — DEXAMETHASONE SODIUM PHOSPHATE 10 MG/ML IJ SOLN
INTRAMUSCULAR | Status: AC
  Filled 2011-01-04: qty 1

## 2011-01-04 MED ORDER — PROPOFOL 10 MG/ML IV EMUL
INTRAVENOUS | Status: DC | PRN
  Administered 2011-01-04: 170 mg via INTRAVENOUS

## 2011-01-04 MED ORDER — LACTATED RINGERS IV SOLN
INTRAVENOUS | Status: DC
  Administered 2011-01-04 (×4): via INTRAVENOUS

## 2011-01-04 MED ORDER — LIDOCAINE-EPINEPHRINE 1 %-1:100000 IJ SOLN
INTRAMUSCULAR | Status: DC | PRN
  Administered 2011-01-04: 4 mL

## 2011-01-04 MED ORDER — NEOSTIGMINE METHYLSULFATE 1 MG/ML IJ SOLN
INTRAMUSCULAR | Status: AC
  Filled 2011-01-04: qty 10

## 2011-01-04 MED ORDER — PROPOFOL 10 MG/ML IV EMUL
INTRAVENOUS | Status: AC
  Filled 2011-01-04: qty 20

## 2011-01-04 MED ORDER — LIDOCAINE HCL (CARDIAC) 20 MG/ML IV SOLN
INTRAVENOUS | Status: DC | PRN
  Administered 2011-01-04: 30 mg via INTRAVENOUS

## 2011-01-04 MED ORDER — SODIUM CHLORIDE 0.9 % IR SOLN
Status: DC | PRN
  Administered 2011-01-04: 10:00:00

## 2011-01-04 MED ORDER — ONDANSETRON HCL 4 MG/2ML IJ SOLN
INTRAMUSCULAR | Status: AC
  Filled 2011-01-04: qty 2

## 2011-01-04 MED ORDER — ESTRADIOL 0.1 MG/GM VA CREA
TOPICAL_CREAM | VAGINAL | Status: DC | PRN
  Administered 2011-01-04: 1 via VAGINAL

## 2011-01-04 MED ORDER — CEFAZOLIN SODIUM 1-5 GM-% IV SOLN
INTRAVENOUS | Status: AC
  Filled 2011-01-04: qty 50

## 2011-01-04 MED ORDER — DEXAMETHASONE SODIUM PHOSPHATE 4 MG/ML IJ SOLN
INTRAMUSCULAR | Status: DC | PRN
  Administered 2011-01-04: 10 mg via INTRAVENOUS

## 2011-01-04 MED ORDER — PHENYLEPHRINE 40 MCG/ML (10ML) SYRINGE FOR IV PUSH (FOR BLOOD PRESSURE SUPPORT)
PREFILLED_SYRINGE | INTRAVENOUS | Status: AC
  Filled 2011-01-04: qty 5

## 2011-01-04 MED ORDER — ONDANSETRON HCL 4 MG/2ML IJ SOLN
INTRAMUSCULAR | Status: DC | PRN
  Administered 2011-01-04: 4 mg via INTRAVENOUS

## 2011-01-04 MED ORDER — OXYCODONE-ACETAMINOPHEN 5-325 MG PO TABS
1.0000 | ORAL_TABLET | ORAL | Status: DC | PRN
  Administered 2011-01-04 – 2011-01-05 (×3): 2 via ORAL
  Filled 2011-01-04 (×3): qty 2

## 2011-01-04 MED ORDER — LIDOCAINE HCL (CARDIAC) 20 MG/ML IV SOLN
INTRAVENOUS | Status: AC
  Filled 2011-01-04: qty 5

## 2011-01-04 MED ORDER — FENTANYL CITRATE 0.05 MG/ML IJ SOLN
INTRAMUSCULAR | Status: AC
  Filled 2011-01-04: qty 5

## 2011-01-04 MED ORDER — HYDROMORPHONE HCL PF 1 MG/ML IJ SOLN
0.2500 mg | INTRAMUSCULAR | Status: DC | PRN
  Administered 2011-01-04 (×4): 0.5 mg via INTRAVENOUS

## 2011-01-04 MED ORDER — GLYCOPYRROLATE 0.2 MG/ML IJ SOLN
INTRAMUSCULAR | Status: DC | PRN
  Administered 2011-01-04: 1 mg via INTRAVENOUS

## 2011-01-04 MED ORDER — MIDAZOLAM HCL 2 MG/2ML IJ SOLN
INTRAMUSCULAR | Status: AC
  Filled 2011-01-04: qty 2

## 2011-01-04 SURGICAL SUPPLY — 63 items
BAG URINE DRAINAGE (UROLOGICAL SUPPLIES) ×3 IMPLANT
BLADE SURG 15 STRL LF C SS BP (BLADE) ×2 IMPLANT
BLADE SURG 15 STRL SS (BLADE) ×1
CANISTER SUCTION 2500CC (MISCELLANEOUS) ×6 IMPLANT
CATH FOLEY 2WAY SLVR  5CC 16FR (CATHETERS) ×1
CATH FOLEY 2WAY SLVR 5CC 16FR (CATHETERS) ×2 IMPLANT
CATH ROBINSON RED A/P 16FR (CATHETERS) ×3 IMPLANT
CONT PATH 16OZ SNAP LID 3702 (MISCELLANEOUS) ×3 IMPLANT
COVER MAYO STAND STRL (DRAPES) ×3 IMPLANT
DECANTER SPIKE VIAL GLASS SM (MISCELLANEOUS) ×6 IMPLANT
DERMABOND ADVANCED (GAUZE/BANDAGES/DRESSINGS) ×1
DERMABOND ADVANCED .7 DNX12 (GAUZE/BANDAGES/DRESSINGS) ×2 IMPLANT
DEVICE CAPIO SUTURING (INSTRUMENTS) ×1
DEVICE CAPIO SUTURING OPC (INSTRUMENTS) ×2 IMPLANT
DRAIN PENROSE 1/4X12 LTX (DRAIN) ×3 IMPLANT
DRAPE CAMERA CLOSED 9X96 (DRAPES) ×3 IMPLANT
DRAPE STERI URO 9X17 APER PCH (DRAPES) ×3 IMPLANT
DRAPE UNDERBUTTOCKS STRL (DRAPE) ×3 IMPLANT
FORMULA 20CAL 3 OZ MEAD (FORMULA) IMPLANT
GAUZE PACKING 1 X5 YD ST (GAUZE/BANDAGES/DRESSINGS) IMPLANT
GAUZE PACKING 2X5 YD STERILE (GAUZE/BANDAGES/DRESSINGS) ×3 IMPLANT
GAUZE SPONGE 4X4 16PLY XRAY LF (GAUZE/BANDAGES/DRESSINGS) ×6 IMPLANT
GLOVE BIO SURGEON STRL SZ7.5 (GLOVE) ×12 IMPLANT
GLOVE BIOGEL PI IND STRL 7.0 (GLOVE) ×8 IMPLANT
GLOVE BIOGEL PI INDICATOR 7.0 (GLOVE) ×4
GLOVE ECLIPSE 6.5 STRL STRAW (GLOVE) ×6 IMPLANT
GOWN PREVENTION PLUS LG XLONG (DISPOSABLE) ×18 IMPLANT
NEEDLE HYPO 22GX1.5 SAFETY (NEEDLE) IMPLANT
NEEDLE MAYO .5 CIRCLE (NEEDLE) IMPLANT
NEEDLE MAYO 6 CRC TAPER PT (NEEDLE) ×3 IMPLANT
NEEDLE SPNL 22GX3.5 QUINCKE BK (NEEDLE) ×3 IMPLANT
NS IRRIG 1000ML POUR BTL (IV SOLUTION) ×3 IMPLANT
PACK VAGINAL WOMENS (CUSTOM PROCEDURE TRAY) ×3 IMPLANT
PENCIL BUTTON HOLSTER BLD 10FT (ELECTRODE) IMPLANT
PLUG CATH AND CAP STER (CATHETERS) ×3 IMPLANT
RETRACTOR STAY HOOK 5MM (MISCELLANEOUS) ×3 IMPLANT
SET CYSTO W/LG BORE CLAMP LF (SET/KITS/TRAYS/PACK) ×3 IMPLANT
SHEET LAVH (DRAPES) ×3 IMPLANT
SLING SYSTEM SPARC (Sling) ×3 IMPLANT
STRIP CLOSURE SKIN 1/2X4 (GAUZE/BANDAGES/DRESSINGS) IMPLANT
SUT CAPIO ETHIBPND (SUTURE) ×9 IMPLANT
SUT SILK 2 0 FS (SUTURE) IMPLANT
SUT VIC AB 0 CT1 18XCR BRD8 (SUTURE) ×4 IMPLANT
SUT VIC AB 0 CT1 27 (SUTURE) ×4
SUT VIC AB 0 CT1 27XBRD ANBCTR (SUTURE) ×8 IMPLANT
SUT VIC AB 0 CT1 36 (SUTURE) ×6 IMPLANT
SUT VIC AB 0 CT1 8-18 (SUTURE) ×2
SUT VIC AB 0 CT2 27 (SUTURE) IMPLANT
SUT VIC AB 2-0 CT1 (SUTURE) ×3 IMPLANT
SUT VIC AB 2-0 CT2 27 (SUTURE) ×3 IMPLANT
SUT VIC AB 2-0 SH 27 (SUTURE) ×4
SUT VIC AB 2-0 SH 27XBRD (SUTURE) ×8 IMPLANT
SUT VIC AB 3-0 PS2 18 (SUTURE)
SUT VIC AB 3-0 PS2 18XBRD (SUTURE) IMPLANT
SUT VIC AB 4-0 PS2 27 (SUTURE) ×3 IMPLANT
SUT VICRYL 0 TIES 12 18 (SUTURE) ×3 IMPLANT
SUT VICRYL 0 UR6 27IN ABS (SUTURE) ×9 IMPLANT
TISSUE REPAIR XENFORM 6X10CM (Tissue) ×3 IMPLANT
TOWEL OR 17X24 6PK STRL BLUE (TOWEL DISPOSABLE) ×9 IMPLANT
TRAY FOLEY CATH 14FR (SET/KITS/TRAYS/PACK) IMPLANT
TUBING CONNECTING 10 (TUBING) ×3 IMPLANT
TUBING NON-CON 1/4 X 20 CONN (TUBING) ×3 IMPLANT
WATER STERILE IRR 1000ML POUR (IV SOLUTION) ×3 IMPLANT

## 2011-01-04 NOTE — Anesthesia Procedure Notes (Signed)
Procedure Name: Intubation Date/Time: 01/04/2011 7:38 AM Performed by: Lincoln Brigham Pre-anesthesia Checklist: Patient identified, Patient being monitored, Emergency Drugs available, Timeout performed and Suction available Patient Re-evaluated:Patient Re-evaluated prior to inductionOxygen Delivery Method: Circle System Utilized Preoxygenation: Pre-oxygenation with 100% oxygen Intubation Type: IV induction Ventilation: Mask ventilation without difficulty Laryngoscope Size: Miller and 2 Grade View: Grade II Tube type: Oral Number of attempts: 1 Airway Equipment and Method: stylet Secured at: 20 cm Tube secured with: Tape Dental Injury: Teeth and Oropharynx as per pre-operative assessment

## 2011-01-04 NOTE — Anesthesia Preprocedure Evaluation (Signed)
Anesthesia Evaluation  Patient identified by MRN, date of birth, ID band Patient awake    Reviewed: Allergy & Precautions, H&P , NPO status , Patient's Chart, lab work & pertinent test results, reviewed documented beta blocker date and time   History of Anesthesia Complications Negative for: history of anesthetic complications  Airway Mallampati: I TM Distance: >3 FB Neck ROM: full    Dental  (+) Teeth Intact   Pulmonary neg pulmonary ROS,  clear to auscultation  Pulmonary exam normal       Cardiovascular Exercise Tolerance: Good hypertension, On Medications - CAD (cardiac cath 5 years ago with no CAD) regular Normal Dextrocardia/situs inversus   Neuro/Psych Chronic back pain on vicodin prn (takes at least once a day for the past two weeks since stopping naproxen in preparation for surgery) Negative Psych ROS   GI/Hepatic negative GI ROS, Neg liver ROS,   Endo/Other  Negative Endocrine ROS  Renal/GU negative Renal ROS  Female GU complaint     Musculoskeletal   Abdominal   Peds  Hematology negative hematology ROS (+)   Anesthesia Other Findings   Reproductive/Obstetrics negative OB ROS                           Anesthesia Physical Anesthesia Plan  ASA: II  Anesthesia Plan: General ETT   Post-op Pain Management:    Induction:   Airway Management Planned:   Additional Equipment:   Intra-op Plan:   Post-operative Plan:   Informed Consent: I have reviewed the patients History and Physical, chart, labs and discussed the procedure including the risks, benefits and alternatives for the proposed anesthesia with the patient or authorized representative who has indicated his/her understanding and acceptance.   Dental Advisory Given  Plan Discussed with: CRNA and Surgeon  Anesthesia Plan Comments:         Anesthesia Quick Evaluation

## 2011-01-04 NOTE — Op Note (Signed)
Preoperative diagnosis: Vault prolapse cystocele mild stress incontinence Post operative diagnosis: Vault prolapse cystocele mild stress incontinence Surgery: Vault prolapse repair; cystocele repair plus graft; sling cystourethropexy, cystoscopy Surgeon Dr. Lorin Picket Aeon Kessner Asst. Dr. Karma Greaser  Ms. Goodpasture had the above diagnosis he consented to the above procedure. Dr Tresa Res performed a hysterectomy and left the vaginal cuff open after suturing the posterior cuff.  Her vaginal cuff was S2 quite well supported and she had a residual grade 2 cystocele or small grade 3 cystocele with a small central defect.  I instilled approximately 20 cc of a lidocaine epinephrine mixture. With my Allis clamp technique I sharply dissected in the midline taking down the overlying anterior vaginal wall from the underlying pubovesicocervical fascia. I then did an anatomic 2 layer anterior repair not shortening the anterior vaginal wall.  I then cystoscoped the patient and she had excellent blue jets bilaterally with no distortion of the ureters and cystoscopically the cystocele repair went very well  I bluntly dissected to the ischial spine and sacrospinous ligament bilaterally. I freed up soft tissue around the spine. I placed a 0 Ethibond with a Cario device 1 fingerbreadth medial to the initial spine bilaterally in a straight line between the spines. I double checked and the position and was very happy with it. I did a rectal examination and there is no injury to the rectum or suture indirect him  Using my usual technique I placed a 0 Vicryl suture into the pelvic sidewall at the level of the urethral vesical angle. I then cut at 10 x 6 dermal graft in the shape of a trapezoid and sewed it in place. It was tens ion-free and laid in nicely.  I then trimmed a minimal amount of anterior vaginal wall mucosa and closed the anterior vaginal wall midline with running 2-0 Vicryl suture. I then closed the cuff with 0  Vicryl suture. He was excellent vaginal length and good reduction of the anterior vaginal wall  I then performed a sling (SPARC). I made a suburethral incision after instilling 3-4 cc of a lidocaine epinephrine mixture. I made 2 short anterior abdominal incisions 1 fingerbreadth above the symphysis pubis 1.5 cm lateral to the midline.  With the bladder emptied I passed a SPARC needle down on top of it along the back of the symphysis pubis staying laterally. I delivered it bilaterally onto the pulpal my index finger.  I cystoscoped the patient and there was no injury to bladder or trocar in the bladder. There is no wiggling of the bladder with wiggling of the trocar. There was excellent blue jets bilaterally. There was no sling material and the urethra  I tensioned the sling over the fat part of a moderate-sized Kelly clamp. A couple of the blue dots irrigated cease to remove the cease. It was very happy with the tension and position of the sling with no springback effect. There was excellent hyper mobility.  The anterior vaginal wall was closed with running 2-0 Vicryl suture followed by 2 interrupted sutures. Sling was cut below this skin level and closed with interrupted 4-0 Vicryl and Dermabond  Vaginal pack with Estrace cream was applied. Hopefully the operational reach the patient's treatment goal

## 2011-01-04 NOTE — Transfer of Care (Signed)
Immediate Anesthesia Transfer of Care Note  Patient: Hailey Bates  Procedure(s) Performed:  HYSTERECTOMY VAGINAL; ANTERIOR (CYSTOCELE) AND POSTERIOR REPAIR (RECTOCELE) - anterior repair/cysto, with graft,no posterior repair; VAGINAL VAULT SUSPENSION; SPARC PROCEDURE; CYSTOSCOPY  Patient Location: PACU  Anesthesia Type: General  Level of Consciousness: awake, alert  and oriented  Airway & Oxygen Therapy: Patient Spontanous Breathing and Patient connected to nasal cannula oxygen  Post-op Assessment: Report given to PACU RN and Post -op Vital signs reviewed and stable  Post vital signs: Reviewed and stable  Complications: No apparent anesthesia complications

## 2011-01-04 NOTE — Interval H&P Note (Signed)
History and Physical Interval Note:  01/04/2011 10:49 AM  Hailey Bates  has presented today for surgery, with the diagnosis of uterine prolapse  The various methods of treatment have been discussed with the patient and family. After consideration of risks, benefits and other options for treatment, the patient has consented to  Procedure(s): HYSTERECTOMY VAGINAL ANTERIOR (CYSTOCELE) AND POSTERIOR REPAIR (RECTOCELE) VAGINAL VAULT SUSPENSION SPARC PROCEDURE CYSTOSCOPY as a surgical intervention .  The patients' history has been reviewed, patient examined, no change in status, stable for surgery.  I have reviewed the patients' chart and labs.  Questions were answered to the patient's satisfaction.     Dedra Matsuo A

## 2011-01-04 NOTE — OR Nursing (Signed)
Procedure included cystocele repair. No rectocele repair.

## 2011-01-04 NOTE — Op Note (Signed)
Preoperative diagnosis: Pelvic prolapse Postoperative diagnosis: Same path pending Procedure total vaginal hysterectomy Surgeon: Dr. Meredeth Ide Assistant: Dr. Leda Quail Anesthesia: Gen. endotracheal Estimated blood loss: 100 cc Complications: None Procedure: The patient was taken to the operating room, and because of a history of back pain, she was placed in the dorsolithotomy position while awake so that her comfort could be assured.  General endotracheal anesthesia was then induced.  The patient was then prepped and draped in usual fashion, and the bladder drained with a catheter.  A posterior weighted and anterior Deaver retractor were placed.  The cervix was grasped on its anterior lip with a Jacobs tenaculum.  The mucosa overlying the cervix was infiltrated with a total of 4 cc of 1% Xylocaine plain.  The mucosa was incised with a knife from 9 to 3:00.  The bladder was dissected sharply off the cervix.  The anterior peritoneum was identified, elevated with a hemostat, and entered atraumatically.  Attention was next turned posteriorly.  A posterior colpotomy incision was made, and a banana retractor was placed.  The uterosacral ligaments were then clamped with Heaney clamps cut and sutured with 0 Vicryl on each side.  Hysterectomy proceeded up the cardinal ligaments, clamping, cutting, and tying in sequence.  The pedicle containing the uterine arteries on each side was identified, clamped, cut, and tied.  The surgery then proceeded at the broad ligament clamping, cutting, and tying in sequence.  The pedicle containing the tube the round ligament and the utero-ovarian ligament on each side was clamped clamped cut and then doubly tied with 0 Vicryl.  The specimen was sent to pathology.  The pedicles were all inspected and were felt to be hemostatic.  The vaginal cuff was then run with 0 Vicryl from 2 until 10:00 incorporating the  vaginal margin and the peritoneum.  An anti-enterocele stitch was  then placed by going into the posterior cul-de-sac suturing through the left uterosacral ligament skimming across the peritoneum suturing to the right uterosacral ligament, and then coming out in the midline and tying.  The wound was urinated and felt to be hemostatic.  Dr. Lorin Picket McDiarmid then proceeded to do his part of the surgery which will be dictated separately.

## 2011-01-04 NOTE — Anesthesia Postprocedure Evaluation (Signed)
  Anesthesia Post-op Note  Patient: Hailey Bates  Procedure(s) Performed:  HYSTERECTOMY VAGINAL; ANTERIOR (CYSTOCELE) AND POSTERIOR REPAIR (RECTOCELE) - anterior repair/cysto, with graft,no posterior repair; VAGINAL VAULT SUSPENSION; SPARC PROCEDURE; CYSTOSCOPY  Patient Location: PACU  Anesthesia Type: General  Level of Consciousness: awake, alert  and oriented  Airway and Oxygen Therapy: Patient Spontanous Breathing  Post-op Pain: mild  Post-op Assessment: Post-op Vital signs reviewed, Patient's Cardiovascular Status Stable, Respiratory Function Stable, Patent Airway, No signs of Nausea or vomiting, Adequate PO intake and Pain level controlled  Post-op Vital Signs: Reviewed and stable  Complications: No apparent anesthesia complications

## 2011-01-04 NOTE — Interval H&P Note (Signed)
History and Physical Interval Note:  01/04/2011 7:12 AM  Hailey Bates  has presented today for surgery, with the diagnosis of uterine prolapse  The various methods of treatment have been discussed with the patient and family. After consideration of risks, benefits and other options for treatment, the patient has consented to  Procedure(s): HYSTERECTOMY VAGINAL ANTERIOR (CYSTOCELE) AND POSTERIOR REPAIR (RECTOCELE) VAGINAL VAULT SUSPENSION SPARC PROCEDURE as a surgical intervention .  The patients' history has been reviewed, patient examined, no change in status, stable for surgery.  I have reviewed the patients' chart and labs.  Questions were answered to the patient's satisfaction.     ROMINE,CYNTHIA P

## 2011-01-05 ENCOUNTER — Encounter (HOSPITAL_COMMUNITY): Payer: Self-pay | Admitting: Obstetrics and Gynecology

## 2011-01-05 LAB — CBC
HCT: 30.3 % — ABNORMAL LOW (ref 36.0–46.0)
MCH: 27.8 pg (ref 26.0–34.0)
MCHC: 32.3 g/dL (ref 30.0–36.0)
RDW: 14.1 % (ref 11.5–15.5)

## 2011-01-05 NOTE — Anesthesia Postprocedure Evaluation (Signed)
  Anesthesia Post-op Note  Patient: Hailey Bates  Procedure(s) Performed:  HYSTERECTOMY VAGINAL; ANTERIOR (CYSTOCELE) AND POSTERIOR REPAIR (RECTOCELE) - anterior repair/cysto, with graft,no posterior repair; VAGINAL VAULT SUSPENSION; SPARC PROCEDURE; CYSTOSCOPY  Patient Location: [facility]  Anesthesia Type: General  Level of Consciousness: awake, alert  and oriented  Airway and Oxygen Therapy: Patient Spontanous Breathing  Post-op Pain: none  Post-op Assessment: Post-op Vital signs reviewed and Patient's Cardiovascular Status Stable  Post-op Vital Signs: stable  Complications: No apparent anesthesia complications

## 2011-01-05 NOTE — Progress Notes (Signed)
Pt. Doing well.  VSS.  Afebrile.   Tolerating regular diet.  Good pain control with minimal medication.  Full discharge instrucitons given and questions answered.

## 2011-01-05 NOTE — Discharge Summary (Signed)
55 yo DWF who presented for TVH, cystocele repair, and sling due to pelvic prolapse.    Past Medical History  Diagnosis Date  . Hypertension   . Neuromuscular disorder     sciatica   Past Surgical History  Procedure Date  . Cardiac catheterization   . Nasal sinus surgery   . Vaginal hysterectomy 01/04/2011    Procedure: HYSTERECTOMY VAGINAL;  Surgeon: Edwena Felty Rindy Kollman;  Location: [facility];  Service: Gynecology;  Laterality: N/A;  . Anterior and posterior repair 01/04/2011    Procedure: ANTERIOR (CYSTOCELE) AND POSTERIOR REPAIR (RECTOCELE);  Surgeon: Martina Sinner, MD;  Location: [facility];  Service: Urology;  Laterality: N/A;  anterior repair/cysto, with graft,no posterior repair  . Vaginal prolapse repair 01/04/2011    Procedure: VAGINAL VAULT SUSPENSION;  Surgeon: Martina Sinner, MD;  Location: [facility];  Service: Urology;  Laterality: N/A;  . Bladder suspension 01/04/2011    Procedure: SPARC PROCEDURE;  Surgeon: Martina Sinner, MD;  Location: [facility];  Service: Urology;  Laterality: N/A;  . Cystoscopy 01/04/2011    Procedure: CYSTOSCOPY;  Surgeon: Martina Sinner, MD;  Location: [facility];  Service: Urology;  Laterality: N/A;  Patient underwent an uncomplicated TVH with Vault prolapse repair; cystocele repair plus graft; sling cystourethropexy,and cystoscopy.  She had an uneventful post operative course, and was sent home on POD #1, afebrile and in good condition, with full D/C instructions from both Dr. Tresa Res and Dr. McDiarmid. Results for orders placed during the hospital encounter of 01/04/11 (from the past 24 hour(s))  CBC     Status: Abnormal   Collection Time   01/05/11  5:45 AM      Component Value Range   WBC 12.8 (*) 4.0 - 10.5 (K/uL)   RBC 3.53 (*) 3.87 - 5.11 (MIL/uL)   Hemoglobin 9.8 (*) 12.0 - 15.0 (g/dL)   HCT 16.1 (*) 09.6 - 46.0 (%)   MCV 85.8  78.0 - 100.0 (fL)   MCH 27.8  26.0 - 34.0 (pg)   MCHC 32.3  30.0 - 36.0 (g/dL)   RDW 04.5  40.9 - 81.1 (%)   Platelets  211  150 - 400 (K/uL)  Path showed a 39 g uterus with small myomas, but otherwise benign.

## 2011-01-05 NOTE — Progress Notes (Signed)
Vitals normal Laboratory tests normal- HB 9.8 Patient alert and stable Pain minimal and well-controlled Post treatment course discussed in detail Followup discussed in detail See orders

## 2011-01-05 NOTE — Progress Notes (Signed)
Pt discharged to home with adult daughter.  Condition stable.  Pt to car via wheelchair with C. Mayford Knife, [place].  No equipment ordered for home at discharge.

## 2011-01-05 NOTE — Addendum Note (Signed)
Addendum  created 01/05/11 0802 by Madison Hickman   Modules edited:Notes Section

## 2011-03-18 ENCOUNTER — Ambulatory Visit: Payer: BC Managed Care – PPO | Admitting: Cardiology

## 2011-05-03 ENCOUNTER — Ambulatory Visit: Payer: BC Managed Care – PPO | Admitting: Cardiology

## 2019-10-28 ENCOUNTER — Other Ambulatory Visit (HOSPITAL_COMMUNITY): Payer: Self-pay | Admitting: Internal Medicine

## 2019-10-28 DIAGNOSIS — M25512 Pain in left shoulder: Secondary | ICD-10-CM

## 2019-12-11 ENCOUNTER — Other Ambulatory Visit: Payer: Self-pay

## 2019-12-11 ENCOUNTER — Ambulatory Visit: Payer: Medicare PPO | Attending: Physician Assistant | Admitting: Physical Therapy

## 2019-12-11 ENCOUNTER — Encounter: Payer: Self-pay | Admitting: Physical Therapy

## 2019-12-11 DIAGNOSIS — M79602 Pain in left arm: Secondary | ICD-10-CM | POA: Diagnosis not present

## 2019-12-11 DIAGNOSIS — M6281 Muscle weakness (generalized): Secondary | ICD-10-CM | POA: Insufficient documentation

## 2019-12-11 NOTE — Therapy (Signed)
[facility] [place], [place], [address] Phone: [phone]   Fax:  [phone]  Physical Therapy Evaluation  Patient Details  Name: Hailey Bates MRN: [id number] Date of Birth: 1955/10/02 Referring Provider (PT): Noemi Chapel, [place]   Encounter Date: 12/11/2019   PT End of Session - 12/11/19 1955    Visit Number 1    Number of Visits 12    Date for PT Re-Evaluation 01/29/20    Authorization Type [employer]; Progress note every 10th visit    PT Start Time 0945    PT Stop Time 1028    PT Time Calculation (min) 43 min    Activity Tolerance Patient tolerated treatment well    Behavior During Therapy [facility] for tasks assessed/performed           Past Medical History:  Diagnosis Date  . Hypertension   . Neuromuscular disorder [facility])    sciatica    Past Surgical History:  Procedure Laterality Date  . ANTERIOR AND POSTERIOR REPAIR  01/04/2011   Procedure: ANTERIOR (CYSTOCELE) AND POSTERIOR REPAIR (RECTOCELE);  Surgeon: Reece Packer, MD;  Location: [facility];  Service: Urology;  Laterality: N/A;  anterior repair/cysto, with graft,no posterior repair  . BLADDER SUSPENSION  01/04/2011   Procedure: [facility] PROCEDURE;  Surgeon: Reece Packer, MD;  Location: [facility];  Service: Urology;  Laterality: N/A;  . CARDIAC CATHETERIZATION    . CYSTOSCOPY  01/04/2011   Procedure: CYSTOSCOPY;  Surgeon: Reece Packer, MD;  Location: [facility];  Service: Urology;  Laterality: N/A;  . NASAL SINUS SURGERY    . VAGINAL HYSTERECTOMY  01/04/2011   Procedure: HYSTERECTOMY VAGINAL;  Surgeon: Lubertha South Romine;  Location: [facility];  Service: Gynecology;  Laterality: N/A;  . VAGINAL PROLAPSE REPAIR  01/04/2011   Procedure: VAGINAL VAULT SUSPENSION;  Surgeon: Reece Packer, MD;  Location: [facility];  Service: Urology;  Laterality: N/A;    There were no vitals filed for this visit.    Subjective Assessment - 12/11/19 1929     Subjective COVID-19 screening performed upon arrival. Patient arrives to physical therapy with reports of left arm pain that began insidiously on October 16, 2019. Patient reports waking up with feeling soreness and pain that radiated from her UT to left thumb and 2nd and 3rd fingers. Patient reports ability to perform ADLs but with pain. Patient reported having an injection with no relief. Patient reports pain at worst as 9/10 with constant deep pain and pain at best as 0/10. Patient's goals are to decrease pain, improve movement and sleep without waking up 2-3x a night.    Pertinent History HTN    Limitations Lifting;House hold activities    Diagnostic tests x-ray: shoulder arthritis    Patient Stated Goals improve movement decrease pain.    Currently in Pain? Yes    Pain Score 5     Pain Location Arm    Pain Orientation Left    Pain Descriptors / Indicators Throbbing;Numbness;Shooting    Pain Type Acute pain    Pain Radiating Towards left thumb    Pain Onset More than a month ago    Pain Frequency Intermittent    Aggravating Factors  "touching it, laying on it, straightening it out"    Pain Relieving Factors "ice, meds"    Effect of Pain on Daily Activities "sleeping, Up 2-3 x a night"              [facility] PT Assessment -  12/11/19 0001      Assessment   Medical Diagnosis left radial nerve entrapment    Referring Provider (PT) Noemi Chapel, PA-C    Onset Date/Surgical Date 10/16/19    Hand Dominance Right    Next MD Visit December    Prior Therapy no      Precautions   Precautions None      Restrictions   Weight Bearing Restrictions No      Balance Screen   Has the patient fallen in the past 6 months No    Has the patient had a decrease in activity level because of a fear of falling?  No    Is the patient reluctant to leave their home because of a fear of falling?  No      Home Environment   Living Environment Private residence      Prior Function   Level of  Independence Independent      Posture/Postural Control   Posture/Postural Control Postural limitations    Postural Limitations Rounded Shoulders;Forward head      ROM / Strength   AROM / PROM / Strength Strength;PROM;AROM      AROM   Overall AROM  Deficits    AROM Assessment Site Shoulder;Elbow    Right/Left Shoulder Left    Left Shoulder Flexion 152 Degrees    Left Shoulder ABduction 135 Degrees    Left Shoulder Internal Rotation --   to abdomen   Left Shoulder External Rotation 48 Degrees    Right/Left Elbow Left    Left Elbow Flexion 20    Left Elbow Extension 148      PROM   Overall PROM Comments left shoulder PROM WNL    PROM Assessment Site Elbow    Right/Left Elbow Left    Left Elbow Flexion 148    Left Elbow Extension 6   from neutral     Strength   Overall Strength Deficits    Strength Assessment Site Shoulder;Elbow;Hand    Right/Left Shoulder Left    Left Shoulder Flexion 3+/5    Left Shoulder ABduction 3+/5    Left Shoulder Internal Rotation 4-/5    Left Shoulder External Rotation 4-/5    Right/Left Elbow Left    Left Elbow Flexion 3+/5    Left Elbow Extension 3+/5    Right/Left hand Right;Left    Right Hand Grip (lbs) 45    Left Hand Grip (lbs) 15      Palpation   Palpation comment tenderness to left UT, triceps region, lateral forearm, and decreased sensation to left thumb                      Objective measurements completed on examination: See above findings.       [facility] Adult PT Treatment/Exercise - 12/11/19 0001      Modalities   Modalities Electrical Stimulation;Moist Heat      Moist Heat Therapy   Number Minutes Moist Heat 10 Minutes    Moist Heat Location Other (comment)   upper arm     Electrical Stimulation   Electrical Stimulation Location triceps    Electrical Stimulation Action pre-mod    Electrical Stimulation Parameters 80-150 hz x10 mins    Electrical Stimulation Goals Pain                  PT  Education - 12/11/19 1953    Education Details radial nerve glide, medial nerve glide, scapular retractions, UT stretch  Person(s) Educated Patient    Methods Explanation;Handout    Comprehension Verbalized understanding;Returned demonstration               PT Long Term Goals - 12/11/19 2011      PT LONG TERM GOAL #1   Title Patient will be independent with HEP and its progression    Time 6    Period Weeks    Status New      PT LONG TERM GOAL #2   Title Patient will report a centralization of left UE neurological symptoms to indicate decrease nerve irritation.    Time 6    Period Weeks    Status New      PT LONG TERM GOAL #3   Title Patient will demonstrate 4/5 left shoulder and elbow MMT to improve stability during functional tasks.    Time 6    Period Weeks    Status New      PT LONG TERM GOAL #4   Title Patinet will report ability to peform ADLs and home tasks with left UE pain less than or equal to 2/10.    Time 6    Period Weeks    Status New                  Plan - 12/11/19 1957    Clinical Impression Statement Patient is a 64 year old right handed female who presents to physical therapy with left arm pain, decreased left elbow ROM, and decreased left UE MMT. Patient with diminished left tricep DTR and normal biceps DTR. Patient noted increased tenderness to palpation to left UT, triceps, and decreased sensation to thumb and index finger. Patient and PT discussed plan of care and HEP to which patient reported understanding.    Personal Factors and Comorbidities Comorbidity 1    Comorbidities HTN    Examination-Activity Limitations Reach Overhead;Dressing    Stability/Clinical Decision Making Stable/Uncomplicated    Clinical Decision Making Low    Rehab Potential Good    PT Frequency 2x / week    PT Duration 6 weeks    PT Treatment/Interventions ADLs/Self Care Home Management;Electrical Stimulation;Ultrasound;Moist Heat;Therapeutic  exercise;Neuromuscular re-education;Manual techniques;Passive range of motion;Patient/family education    PT Next Visit Plan UBE if tolerable, radial nerve glides, elbow flexion/extension to tolerance, wrist flexion/extension and pronation/supination to tolerance. Modalities PRN for pain relief    PT Home Exercise Plan see patient education section    Consulted and Agree with Plan of Care Patient           Patient will benefit from skilled therapeutic intervention in order to improve the following deficits and impairments:  Decreased range of motion, Decreased strength, Impaired UE functional use, Pain, Impaired sensation, Decreased activity tolerance  Visit Diagnosis: Pain in left arm - Plan: PT plan of care cert/re-cert  Muscle weakness (generalized) - Plan: PT plan of care cert/re-cert     Problem List There are no problems to display for this patient.   Hailey Bates, PT, DPT 12/11/2019, 9:24 PM  [facility] [address] [place], [place], [address] Phone: [phone]   Fax:  [phone]  Name: Hailey Bates MRN: [id number] Date of Birth: 08/26/1955

## 2019-12-13 ENCOUNTER — Ambulatory Visit: Payer: Medicare PPO | Admitting: Physical Therapy

## 2019-12-13 ENCOUNTER — Encounter: Payer: Self-pay | Admitting: Physical Therapy

## 2019-12-13 ENCOUNTER — Other Ambulatory Visit: Payer: Self-pay

## 2019-12-13 DIAGNOSIS — M79602 Pain in left arm: Secondary | ICD-10-CM | POA: Diagnosis not present

## 2019-12-13 DIAGNOSIS — M6281 Muscle weakness (generalized): Secondary | ICD-10-CM

## 2019-12-13 NOTE — Therapy (Signed)
[facility] [place], [place], [address] Phone: [phone]   Fax:  [phone]  Physical Therapy Treatment  Patient Details  Name: Hailey Bates MRN: [id number] Date of Birth: 12/21/1955 Referring Provider (PT): Noemi Chapel, [place]   Encounter Date: 12/13/2019   PT End of Session - 12/13/19 0949    Visit Number 2    Number of Visits 12    Date for PT Re-Evaluation 01/29/20    Authorization Type [employer]; Progress note every 10th visit    Authorization Time Period 12/11/19-12/31/202112 visits    Authorization - Visit Number 2    Authorization - Number of Visits 12    PT Start Time 0945    PT Stop Time 1033    PT Time Calculation (min) 48 min    Activity Tolerance Patient tolerated treatment well    Behavior During Therapy [facility] for tasks assessed/performed           Past Medical History:  Diagnosis Date  . Hypertension   . Neuromuscular disorder [facility])    sciatica    Past Surgical History:  Procedure Laterality Date  . ANTERIOR AND POSTERIOR REPAIR  01/04/2011   Procedure: ANTERIOR (CYSTOCELE) AND POSTERIOR REPAIR (RECTOCELE);  Surgeon: Reece Packer, MD;  Location: [facility];  Service: Urology;  Laterality: N/A;  anterior repair/cysto, with graft,no posterior repair  . BLADDER SUSPENSION  01/04/2011   Procedure: [facility] PROCEDURE;  Surgeon: Reece Packer, MD;  Location: [facility];  Service: Urology;  Laterality: N/A;  . CARDIAC CATHETERIZATION    . CYSTOSCOPY  01/04/2011   Procedure: CYSTOSCOPY;  Surgeon: Reece Packer, MD;  Location: [facility];  Service: Urology;  Laterality: N/A;  . NASAL SINUS SURGERY    . VAGINAL HYSTERECTOMY  01/04/2011   Procedure: HYSTERECTOMY VAGINAL;  Surgeon: Lubertha South Romine;  Location: [facility];  Service: Gynecology;  Laterality: N/A;  . VAGINAL PROLAPSE REPAIR  01/04/2011   Procedure: VAGINAL VAULT SUSPENSION;  Surgeon: Reece Packer, MD;  Location: [facility];  Service:  Urology;  Laterality: N/A;    There were no vitals filed for this visit.   Subjective Assessment - 12/13/19 0948    Subjective COVID-19 screening performed upon arrival. Reports having a bad night and had to sleep in the recliner. Patient reports 2/10 currently. States she feels likes she's gotten more mobility since performing HEP.    Pertinent History HTN    Limitations Lifting;House hold activities    Diagnostic tests x-ray: shoulder arthritis    Patient Stated Goals improve movement decrease pain.    Currently in Pain? Yes    Pain Score 2     Pain Location Arm    Pain Orientation Left    Pain Descriptors / Indicators Throbbing    Pain Type Acute pain    Pain Onset More than a month ago    Pain Frequency Intermittent              [facility] PT Assessment - 12/13/19 0001      Assessment   Medical Diagnosis left radial nerve entrapment    Referring Provider (PT) Noemi Chapel, PA-C    Onset Date/Surgical Date 10/16/19    Hand Dominance Right    Next MD Visit December    Prior Therapy no      Precautions   Precautions None  [facility] Adult PT Treatment/Exercise - 12/13/19 0001      Exercises   Exercises Elbow;Wrist;Neck      Elbow Exercises   Forearm Supination AROM;Left;20 reps;Seated    Forearm Supination Limitations red therabar    Forearm Pronation AROM;Left;20 reps;Seated    Forearm Pronation Limitations red therabar      Neck Exercises: Machines for Strengthening   UBE (Upper Arm Bike) 120 RPM x6 mins (3 fwd, 3 bwd      Neck Exercises: Seated   Shoulder Rolls Backwards;20 reps    Other Seated Exercise chin tucks 3" hold 2x10      Wrist Exercises   Wrist Flexion Strengthening;Both;20 reps;Seated    Wrist Flexion Limitations red therabar    Wrist Extension Strengthening;Both;20 reps;Seated    Wrist Extension Limitations red therabar    Other wrist exercises wrist extension stretch 10" hold x10; wrist flexion stretch 10"  hold x10      Modalities   Modalities Electrical Stimulation;Cryotherapy      Cryotherapy   Number Minutes Cryotherapy 10 Minutes    Cryotherapy Location Upper arm    Type of Cryotherapy Ice pack      Electrical Stimulation   Electrical Stimulation Location triceps    Electrical Stimulation Action pre-mod    Electrical Stimulation Parameters 80-150 hz x10    Electrical Stimulation Goals Pain      Manual Therapy   Manual Therapy Passive ROM;Neural Stretch    Passive ROM PROM into left elbow flexion and extension intermittent oscillation to decrease muscle guarding    Neural Stretch passive radial nerve glides and median nerve glides      Neck Exercises: Stretches   Levator Stretch Left;2 reps;30 seconds                       PT Long Term Goals - 12/11/19 2011      PT LONG TERM GOAL #1   Title Patient will be independent with HEP and its progression    Time 6    Period Weeks    Status New      PT LONG TERM GOAL #2   Title Patient will report a centralization of left UE neurological symptoms to indicate decrease nerve irritation.    Time 6    Period Weeks    Status New      PT LONG TERM GOAL #3   Title Patient will demonstrate 4/5 left shoulder and elbow MMT to improve stability during functional tasks.    Time 6    Period Weeks    Status New      PT LONG TERM GOAL #4   Title Patinet will report ability to peform ADLs and home tasks with left UE pain less than or equal to 2/10.    Time 6    Period Weeks    Status New                 Plan - 12/13/19 1028    Clinical Impression Statement Patient responded well to therapy session with no reports of increased pain. Patient guided through wrist and elbow strengthening with good form and technique. Patient noted with some increased nerve tension with PROM nerve glides but overall decreased when PROM reduced. Ice and e-stim performed with no adverse affects upon removal.    Personal Factors and  Comorbidities Comorbidity 1    Comorbidities HTN    Examination-Activity Limitations Reach Overhead;Dressing    Stability/Clinical Decision Making Stable/Uncomplicated  Clinical Decision Making Low    Rehab Potential Good    PT Frequency 2x / week    PT Duration 6 weeks    PT Treatment/Interventions ADLs/Self Care Home Management;Electrical Stimulation;Ultrasound;Moist Heat;Therapeutic exercise;Neuromuscular re-education;Manual techniques;Passive range of motion;Patient/family education    PT Next Visit Plan UBE if tolerable, radial nerve glides, elbow flexion/extension to tolerance, wrist flexion/extension and pronation/supination to tolerance. Modalities PRN for pain relief    PT Home Exercise Plan see patient education section    Consulted and Agree with Plan of Care Patient           Patient will benefit from skilled therapeutic intervention in order to improve the following deficits and impairments:  Decreased range of motion, Decreased strength, Impaired UE functional use, Pain, Impaired sensation, Decreased activity tolerance  Visit Diagnosis: Pain in left arm  Muscle weakness (generalized)     Problem List There are no problems to display for this patient.   Gabriela Eves, PT, DPT 12/13/2019, 10:39 AM  [facility] [address] [place], [place], [address] Phone: [phone]   Fax:  [phone]  Name: TISH BEGIN MRN: [id number] Date of Birth: 1955-04-06

## 2019-12-18 ENCOUNTER — Ambulatory Visit: Payer: Medicare PPO | Admitting: Physical Therapy

## 2019-12-18 ENCOUNTER — Other Ambulatory Visit: Payer: Self-pay

## 2019-12-18 DIAGNOSIS — M79602 Pain in left arm: Secondary | ICD-10-CM

## 2019-12-18 DIAGNOSIS — M6281 Muscle weakness (generalized): Secondary | ICD-10-CM

## 2019-12-18 NOTE — Therapy (Signed)
Hailey Bates, [place], [address] Phone: [phone]   Fax:  [phone]  Physical Therapy Treatment  Patient Details  Name: [name] MRN: [id number] Date of Birth: December 28, 1955 Referring Provider (PT): Noemi Chapel, [place]   Encounter Date: 12/18/2019   PT End of Session - 12/18/19 1030    Visit Number 3    Number of Visits 12    Date for PT Re-Evaluation 01/29/20    Authorization Type [employer]; Progress note every 10th visit    Authorization Time Period 12/11/19-12/31/202112 visits    Authorization - Visit Number 3    Authorization - Number of Visits 12    PT Start Time 0945    PT Stop Time 1038    PT Time Calculation (min) 53 min    Activity Tolerance Patient tolerated treatment well    Behavior During Therapy [facility] for tasks assessed/performed           Past Medical History:  Diagnosis Date  . Hypertension   . Neuromuscular disorder [facility])    sciatica    Past Surgical History:  Procedure Laterality Date  . ANTERIOR AND POSTERIOR REPAIR  01/04/2011   Procedure: ANTERIOR (CYSTOCELE) AND POSTERIOR REPAIR (RECTOCELE);  Surgeon: Reece Packer, MD;  Location: [facility];  Service: Urology;  Laterality: N/A;  anterior repair/cysto, with graft,no posterior repair  . BLADDER SUSPENSION  01/04/2011   Procedure: [facility] PROCEDURE;  Surgeon: Reece Packer, MD;  Location: [place];  Service: Urology;  Laterality: N/A;  . CARDIAC CATHETERIZATION    . CYSTOSCOPY  01/04/2011   Procedure: CYSTOSCOPY;  Surgeon: Reece Packer, MD;  Location: [facility];  Service: Urology;  Laterality: N/A;  . NASAL SINUS SURGERY    . VAGINAL HYSTERECTOMY  01/04/2011   Procedure: HYSTERECTOMY VAGINAL;  Surgeon: Lubertha South Romine;  Location: [facility];  Service: Gynecology;  Laterality: N/A;  . VAGINAL PROLAPSE REPAIR  01/04/2011   Procedure: VAGINAL VAULT SUSPENSION;  Surgeon: Reece Packer, MD;  Location: [facility];  Service:  Urology;  Laterality: N/A;    There were no vitals filed for this visit.   Subjective Assessment - 12/18/19 1044    Subjective COVID-19 screening performed upon arrival. Reports doing well after last session but a significant increase of pain on Saturday and Sunday which she was almost about to go to the urgent care. Patient reports pain has subsided yesterday and today but still feeling some pain.    Pertinent History HTN    Limitations Lifting;House hold activities    Diagnostic tests x-ray: shoulder arthritis    Patient Stated Goals improve movement decrease pain.    Currently in Pain? Yes    Pain Score 2     Pain Location Arm    Pain Orientation Left    Pain Descriptors / Indicators Throbbing;Tightness    Pain Type Acute pain    Pain Onset More than a month ago    Pain Frequency Intermittent              [facility] PT Assessment - 12/18/19 0001      Assessment   Medical Diagnosis left radial nerve entrapment    Referring Provider (PT) Noemi Chapel, PA-C    Onset Date/Surgical Date 10/16/19    Hand Dominance Right    Next MD Visit December    Prior Therapy no      Precautions   Precautions None  [facility] Adult PT Treatment/Exercise - 12/18/19 0001      Exercises   Exercises Shoulder      Elbow Exercises   Elbow Extension Strengthening;Left;20 reps;Standing    Theraband Level (Elbow Extension) Level 1 (Yellow)      Neck Exercises: Machines for Strengthening   UBE (Upper Arm Bike) 120 RPM x8 mins (4 fwd, 4 bwd)      Shoulder Exercises: Standing   Protraction Strengthening;Left;20 reps;Theraband    Theraband Level (Shoulder Protraction) Level 1 (Yellow)    External Rotation Strengthening    Theraband Level (Shoulder External Rotation) Level 1 (Yellow)    Internal Rotation AAROM;Both;20 reps;Other (comment);Strengthening   behind the back IR; followed by    Theraband Level (Shoulder Internal Rotation) Level 1 (Yellow)     Extension AROM;20 reps   with wrist flexion    Row Strengthening;Left;20 reps;Theraband    Theraband Level (Shoulder Row) Level 1 (Yellow)      Wrist Exercises   Wrist Flexion Strengthening;Both;20 reps;Seated    Wrist Flexion Limitations red therabad    Wrist Extension Strengthening;Both;20 reps;Seated    Wrist Extension Limitations red therabar      Modalities   Modalities Electrical Stimulation;Cryotherapy      Cryotherapy   Number Minutes Cryotherapy 10 Minutes    Cryotherapy Location Upper arm    Type of Cryotherapy Ice pack      Electrical Stimulation   Electrical Stimulation Location tricpes region    Electrical Stimulation Action pre-mod    Electrical Stimulation Parameters 80-150 hz x10 mins    Electrical Stimulation Goals Pain      Manual Therapy   Manual Therapy Passive ROM;Neural Stretch    Passive ROM PROM into left elbow flexion and extension intermittent oscillation to decrease muscle guarding    Neural Stretch passive radial nerve glides and median nerve glides                       PT Long Term Goals - 12/11/19 2011      PT LONG TERM GOAL #1   Title Patient will be independent with HEP and its progression    Time 6    Period Weeks    Status New      PT LONG TERM GOAL #2   Title Patient will report a centralization of left UE neurological symptoms to indicate decrease nerve irritation.    Time 6    Period Weeks    Status New      PT LONG TERM GOAL #3   Title Patient will demonstrate 4/5 left shoulder and elbow MMT to improve stability during functional tasks.    Time 6    Period Weeks    Status New      PT LONG TERM GOAL #4   Title Patinet will report ability to peform ADLs and home tasks with left UE pain less than or equal to 2/10.    Time 6    Period Weeks    Status New                 Plan - 12/18/19 1037    Clinical Impression Statement Patient arrived to physical therapy with mild reports of pain. Patient guided  through more shoulder TEs and strengthening with verbal and tactile cuing for form and technique. Patient noted with slight increase of nerve tension particularly in left wrist and forearm with passive nerve glides. Patient instructed to continue HEP to which she reported understanding. No  adverse affects upon removal of modalities.    Personal Factors and Comorbidities Comorbidity 1    Comorbidities HTN    Examination-Activity Limitations Reach Overhead;Dressing    Stability/Clinical Decision Making Stable/Uncomplicated    Clinical Decision Making Low    Rehab Potential Good    PT Frequency 2x / week    PT Duration 6 weeks    PT Treatment/Interventions ADLs/Self Care Home Management;Electrical Stimulation;Ultrasound;Moist Heat;Therapeutic exercise;Neuromuscular re-education;Manual techniques;Passive range of motion;Patient/family education    PT Next Visit Plan cont with POC; UBE, radial nerve glides, elbow flexion/extension to tolerance, wrist flexion/extension and pronation/supination to tolerance. Modalities PRN for pain relief    PT Home Exercise Plan see patient education section           Patient will benefit from skilled therapeutic intervention in order to improve the following deficits and impairments:  Decreased range of motion, Decreased strength, Impaired UE functional use, Pain, Impaired sensation, Decreased activity tolerance  Visit Diagnosis: Pain in left arm  Muscle weakness (generalized)     Problem List There are no problems to display for this patient.   Gabriela Eves, PT, DPT 12/18/2019, 10:46 AM  [facility] [address] [place], [place], [address] Phone: [phone]   Fax:  [phone]  Name: HINDA LINDOR MRN: [id number] Date of Birth: 12-17-1955

## 2019-12-19 ENCOUNTER — Ambulatory Visit: Payer: Medicare PPO | Admitting: Physical Therapy

## 2019-12-19 DIAGNOSIS — M79602 Pain in left arm: Secondary | ICD-10-CM

## 2019-12-19 DIAGNOSIS — M6281 Muscle weakness (generalized): Secondary | ICD-10-CM

## 2019-12-19 NOTE — Patient Instructions (Signed)
°  Additional instructions: This stretch should be done throughout the day, especially before activity.  After recovery, this stretch should be included as part of a warm-up to activities that involve  gripping, such as gardening, tennis, and golf. Step-by-step directions  Straighten your arm and bend your wrist back as if signaling someone to  stop.  Use your opposite hand to apply gentle pressure across the palm and pull  it toward you until you feel a stretch on the inside of your forearm.   Hold the stretch for 15 seconds.  Repeat 5 times, then perform this stretch on the other arm.   Step-by-step directions  Straighten your arm with your palm facing down and bend your wrist so  that your fingers point down.  Gently pull your hand toward your body until you feel a stretch on the  outside of your forearm.  Hold the stretch for 15 seconds.  Repeat 5 times, then perform this stretch on the other arm       Stand comfortably with your arms loose at  your sides.  Drop your shoulder down and reach your  fingers toward the floor.  Internally rotate your arm (thumb toward  your body) and flex your wrist with the palm  up.  Gently tilt your head away from the side you  are stretching.  Raise your arm up and away from your body as you continue to flex your wrist and tilt your head.  Hold each position of the glide for 3 to 5 second

## 2019-12-19 NOTE — Therapy (Signed)
[facility] [place], [place], [address] Phone: [phone]   Fax:  [phone]  Physical Therapy Treatment  Patient Details  Name: RAMATA STROTHMAN MRN: [id number] Date of Birth: [birth year] Referring Provider (PT): Noemi Chapel, [place]   Encounter Date: 12/19/2019   PT End of Session - 12/19/19 1204    Visit Number 4    Number of Visits 12    Date for PT Re-Evaluation 01/29/20    Authorization Type [employer]; Progress note every 10th visit    Authorization Time Period 12/11/19-12/31/202112 visits    Authorization - Visit Number 4    Authorization - Number of Visits 12    PT Start Time 1030    PT Stop Time 1114    PT Time Calculation (min) 44 min    Activity Tolerance Patient tolerated treatment well    Behavior During Therapy [facility] for tasks assessed/performed           Past Medical History:  Diagnosis Date  . Hypertension   . Neuromuscular disorder [facility])    sciatica    Past Surgical History:  Procedure Laterality Date  . ANTERIOR AND POSTERIOR REPAIR  01/04/2011   Procedure: ANTERIOR (CYSTOCELE) AND POSTERIOR REPAIR (RECTOCELE);  Surgeon: Reece Packer, MD;  Location: [facility];  Service: Urology;  Laterality: N/A;  anterior repair/cysto, with graft,no posterior repair  . BLADDER SUSPENSION  01/04/2011   Procedure: [facility] PROCEDURE;  Surgeon: Reece Packer, MD;  Location: [place] [place] [facility];  Service: Urology;  Laterality: N/A;  . CARDIAC CATHETERIZATION    . CYSTOSCOPY  01/04/2011   Procedure: CYSTOSCOPY;  Surgeon: Reece Packer, MD;  Location: [facility];  Service: Urology;  Laterality: N/A;  . NASAL SINUS SURGERY    . VAGINAL HYSTERECTOMY  01/04/2011   Procedure: HYSTERECTOMY VAGINAL;  Surgeon: Lubertha South Romine;  Location: [facility];  Service: Gynecology;  Laterality: N/A;  . VAGINAL PROLAPSE REPAIR  01/04/2011   Procedure: VAGINAL VAULT SUSPENSION;  Surgeon: Reece Packer, MD;  Location: [facility];  Service:  Urology;  Laterality: N/A;    There were no vitals filed for this visit.   Subjective Assessment - 12/19/19 1032    Subjective COVID-19 screening performed upon arrival. Patient arrived with ongoing pain although feels like exercises are helping.    Pertinent History HTN    Limitations Lifting;House hold activities    Diagnostic tests x-ray: shoulder arthritis    Patient Stated Goals improve movement decrease pain.    Currently in Pain? Yes    Pain Score 4     Pain Location Arm    Pain Orientation Left    Pain Descriptors / Indicators Discomfort;Sore    Pain Type Acute pain    Pain Onset More than a month ago    Pain Frequency Intermittent    Aggravating Factors  certain movements or anything that touches arm    Pain Relieving Factors meds/ice                             [facility] Adult PT Treatment/Exercise - 12/19/19 0001      Exercises   Exercises Hand;Elbow      Elbow Exercises   Forearm Supination AROM;Left;20 reps;Seated    Wrist Flexion Strengthening;Left;20 reps;Seated    Bar Weights/Barbell (Wrist Flexion) 1 lb    Wrist Extension Strengthening;Left;20 reps;Seated    Bar Weights/Barbell (Wrist Extension) 1 lb      Neck Exercises:  Machines for Strengthening   UBE (Upper Arm Bike) 120 RPM x8 mins (4 fwd, 4 bwd)      Neck Exercises: Standing   Other Standing Exercises standing radial nerve stretches 20sec x2      Shoulder Exercises: Standing   Protraction Strengthening;Left;20 reps;Theraband    Theraband Level (Shoulder Protraction) Level 1 (Yellow)    External Rotation Strengthening;Left;20 reps;Theraband    Theraband Level (Shoulder External Rotation) Level 1 (Yellow)    Internal Rotation Strengthening;Both;20 reps;Theraband    Theraband Level (Shoulder Internal Rotation) Level 1 (Yellow)    Extension AROM;20 reps   with wrist ext   Row Strengthening;Left;20 reps;Theraband    Theraband Level (Shoulder Row) Level 1 (Yellow)    Other Standing  Exercises standing scap retraction w red band x20      Wrist Exercises   Other wrist exercises Wrist ext stretch 20sec x2, wrist flexion stretch 20sec x 2reps      Cryotherapy   Number Minutes Cryotherapy 10 Minutes    Cryotherapy Location Upper arm    Type of Cryotherapy Ice pack      Electrical Stimulation   Electrical Stimulation Location triceps region    Electrical Stimulation Action premod    Electrical Stimulation Parameters 80-150hz  x32min    Electrical Stimulation Goals Pain                  PT Education - 12/19/19 1204    Education Details HEP per request    Person(s) Educated Patient    Methods Explanation;Handout;Demonstration    Comprehension Verbalized understanding;Returned demonstration               PT Long Term Goals - 12/19/19 1039      PT LONG TERM GOAL #1   Title Patient will be independent with HEP and its progression    Time 6    Period Weeks    Status On-going      PT LONG TERM GOAL #2   Title Patient will report a centralization of left UE neurological symptoms to indicate decrease nerve irritation.    Baseline ongoing symptoms 12/19/19    Time 6    Period Weeks    Status On-going      PT LONG TERM GOAL #3   Title Patient will demonstrate 4/5 left shoulder and elbow MMT to improve stability during functional tasks.    Time 6    Period Weeks    Status On-going      PT LONG TERM GOAL #4   Title Patinet will report ability to peform ADLs and home tasks with left UE pain less than or equal to 2/10.    Baseline pain over 4/10 12/19/19    Time 6    Period Weeks    Status On-going                 Plan - 12/19/19 1206    Clinical Impression Statement Patient tolerated treatment well today yet with ongoing symptoms. Patient issued new HEP for stretches today per requested. Patient feels ongoing pain and symptoms and has not been doing stretches at home. Today reviewd technique with new printout. Patient doing well with left  UE strengthening today. patient current goals ongoing.    Personal Factors and Comorbidities Comorbidity 1    Comorbidities HTN    Examination-Activity Limitations Reach Overhead;Dressing    Stability/Clinical Decision Making Stable/Uncomplicated    Rehab Potential Good    PT Frequency 2x / week    PT  Duration 6 weeks    PT Treatment/Interventions ADLs/Self Care Home Management;Electrical Stimulation;Ultrasound;Moist Heat;Therapeutic exercise;Neuromuscular re-education;Manual techniques;Passive range of motion;Patient/family education    PT Next Visit Plan cont with POC; UBE, radial nerve glides, elbow flexion/extension to tolerance, wrist flexion/extension and pronation/supination to tolerance. Modalities PRN for pain relief    Consulted and Agree with Plan of Care Patient           Patient will benefit from skilled therapeutic intervention in order to improve the following deficits and impairments:  Decreased range of motion, Decreased strength, Impaired UE functional use, Pain, Impaired sensation, Decreased activity tolerance  Visit Diagnosis: Pain in left arm  Muscle weakness (generalized)     Problem List There are no problems to display for this patient.   Phillips Climes, PTA 12/19/2019, 12:08 PM  [facility] [place], [place], [address] Phone: [phone]   Fax:  [phone]  Name: ANAMARI GALEAS MRN: [id number] Date of Birth: 07/23/1955

## 2019-12-20 ENCOUNTER — Encounter: Payer: Medicare PPO | Admitting: Physical Therapy

## 2020-01-01 ENCOUNTER — Other Ambulatory Visit: Payer: Self-pay

## 2020-01-01 ENCOUNTER — Ambulatory Visit: Payer: Medicare PPO | Attending: Physician Assistant | Admitting: Physical Therapy

## 2020-01-01 ENCOUNTER — Encounter: Payer: Self-pay | Admitting: Physical Therapy

## 2020-01-01 DIAGNOSIS — M79602 Pain in left arm: Secondary | ICD-10-CM | POA: Diagnosis not present

## 2020-01-01 DIAGNOSIS — M6281 Muscle weakness (generalized): Secondary | ICD-10-CM | POA: Insufficient documentation

## 2020-01-01 NOTE — Therapy (Addendum)
[facility] [place], [place], [address] Phone: [phone]   Fax:  [phone]  Physical Therapy Treatment  Patient Details  Name: Hailey Bates MRN: [id number] Date of Birth: [birth year] Referring Provider (PT): Noemi Chapel, [place]   Encounter Date: 01/01/2020   PT End of Session - 01/01/20 1303    Visit Number 5    Number of Visits 12    Date for PT Re-Evaluation 01/29/20    Authorization Type [employer]; Progress note every 10th visit    Authorization Time Period 12/11/19-12/31/202112 visits    Authorization - Visit Number 5    Authorization - Number of Visits 12    PT Start Time 0945    PT Stop Time 1033    PT Time Calculation (min) 48 min    Activity Tolerance Patient tolerated treatment well    Behavior During Therapy [facility] for tasks assessed/performed           Past Medical History:  Diagnosis Date  . Hypertension   . Neuromuscular disorder [facility])    sciatica    Past Surgical History:  Procedure Laterality Date  . ANTERIOR AND POSTERIOR REPAIR  01/04/2011   Procedure: ANTERIOR (CYSTOCELE) AND POSTERIOR REPAIR (RECTOCELE);  Surgeon: Reece Packer, MD;  Location: [facility];  Service: Urology;  Laterality: N/A;  anterior repair/cysto, with graft,no posterior repair  . BLADDER SUSPENSION  01/04/2011   Procedure: [facility] PROCEDURE;  Surgeon: Reece Packer, MD;  Location: [facility];  Service: Urology;  Laterality: N/A;  . CARDIAC CATHETERIZATION    . CYSTOSCOPY  01/04/2011   Procedure: CYSTOSCOPY;  Surgeon: Reece Packer, MD;  Location: [facility];  Service: Urology;  Laterality: N/A;  . NASAL SINUS SURGERY    . VAGINAL HYSTERECTOMY  01/04/2011   Procedure: HYSTERECTOMY VAGINAL;  Surgeon: Lubertha South Romine;  Location: [facility];  Service: Gynecology;  Laterality: N/A;  . VAGINAL PROLAPSE REPAIR  01/04/2011   Procedure: VAGINAL VAULT SUSPENSION;  Surgeon: Reece Packer, MD;  Location: [facility];  Service:  Urology;  Laterality: N/A;    There were no vitals filed for this visit.   Subjective Assessment - 01/01/20 1013    Subjective COVID-19 screening performed upon arrival. Patient reports significant improvements in pain but weakness in L 4-5th fingers that started about 1 week ago.    Pertinent History HTN    Limitations Lifting;House hold activities    Diagnostic tests x-ray: shoulder arthritis    Patient Stated Goals improve movement decrease pain.    Currently in Pain? No/denies              [facility] PT Assessment - 01/01/20 0001      Assessment   Medical Diagnosis left radial nerve entrapment    Referring Provider (PT) Noemi Chapel, PA-C    Onset Date/Surgical Date 10/16/19    Hand Dominance Right    Next MD Visit December    Prior Therapy no      Precautions   Precautions None                         [facility] Adult PT Treatment/Exercise - 01/01/20 0001      Elbow Exercises   Elbow Extension Strengthening;Left;20 reps;Standing    Theraband Level (Elbow Extension) Level 1 (Yellow)    Forearm Supination AROM;Left;20 reps;Seated    Forearm Supination Limitations green therabar    Forearm Pronation AROM;Left;20 reps;Seated    Forearm Pronation Limitations  green therabar    Other elbow exercises ulnar nerve glides x10      Neck Exercises: Machines for Strengthening   UBE (Upper Arm Bike) 90 RPM x8 mins (4 fwd, 4 bwd)      Hand Exercises   Other Hand Exercises Red web for grip strength x20 followed by digit extension with rubber band x20      Modalities   Modalities Electrical Stimulation;Cryotherapy      Moist Heat Therapy   Number Minutes Moist Heat 10 Minutes    Moist Heat Location Shoulder      Electrical Stimulation   Electrical Stimulation Location L UT    Electrical Stimulation Action IFC    Electrical Stimulation Parameters 80-150 hz x10 mins    Electrical Stimulation Goals Pain                  PT Education - 01/01/20 1314     Education Details ulnar nerve glides, tricep extension, yellow theraband    Person(s) Educated Patient    Methods Explanation;Demonstration;Handout    Comprehension Verbalized understanding;Returned demonstration;Verbal cues required               PT Long Term Goals - 12/19/19 1039      PT LONG TERM GOAL #1   Title Patient will be independent with HEP and its progression    Time 6    Period Weeks    Status On-going      PT LONG TERM GOAL #2   Title Patient will report a centralization of left UE neurological symptoms to indicate decrease nerve irritation.    Baseline ongoing symptoms 12/19/19    Time 6    Period Weeks    Status On-going      PT LONG TERM GOAL #3   Title Patient will demonstrate 4/5 left shoulder and elbow MMT to improve stability during functional tasks.    Time 6    Period Weeks    Status On-going      PT LONG TERM GOAL #4   Title Patinet will report ability to peform ADLs and home tasks with left UE pain less than or equal to 2/10.    Baseline pain over 4/10 12/19/19    Time 6    Period Weeks    Status On-going                 Plan - 01/01/20 1226    Clinical Impression Statement Patient arrived to physical therapy with decreased pain but with more weakness in left hand. Patient expressed her concern with the onset of weakness. Patient guided through TEs for wrist and hand strengthening as well as ulnar nerve glides. Patient stated she would like to see a neurologist instead of an orthopedic and patient and PT discussed speaking to PCP for referral. Patient reported understanding and agreement. Patient provided with new HEP to which patient reported understanding. No adverse affects upon removal of modalities.    Personal Factors and Comorbidities Comorbidity 1    Comorbidities HTN    Examination-Activity Limitations Reach Overhead;Dressing    Stability/Clinical Decision Making Stable/Uncomplicated    Clinical Decision Making Low    Rehab  Potential Good    PT Frequency 2x / week    PT Duration 6 weeks    PT Treatment/Interventions ADLs/Self Care Home Management;Electrical Stimulation;Ultrasound;Moist Heat;Therapeutic exercise;Neuromuscular re-education;Manual techniques;Passive range of motion;Patient/family education    PT Next Visit Plan cont with POC; UBE, radial and ulnar nerve glides, elbow flexion/extension to  tolerance, wrist flexion/extension and pronation/supination to tolerance. Modalities PRN for pain relief    PT Home Exercise Plan see patient education section    Consulted and Agree with Plan of Care Patient           Patient will benefit from skilled therapeutic intervention in order to improve the following deficits and impairments:  Decreased range of motion, Decreased strength, Impaired UE functional use, Pain, Impaired sensation, Decreased activity tolerance  Visit Diagnosis: Pain in left arm  Muscle weakness (generalized)     Problem List There are no problems to display for this patient.   Gabriela Eves, PT, DPT 01/01/2020, 1:15 PM  [facility] [address] [place], [place], [address] Phone: [phone]   Fax:  [phone]  Name: Hailey Bates MRN: [id number] Date of Birth: Dec 26, 1955

## 2020-01-03 ENCOUNTER — Ambulatory Visit: Payer: Medicare PPO | Admitting: Physical Therapy

## 2020-01-03 ENCOUNTER — Other Ambulatory Visit: Payer: Self-pay

## 2020-01-03 DIAGNOSIS — M79602 Pain in left arm: Secondary | ICD-10-CM | POA: Diagnosis not present

## 2020-01-03 DIAGNOSIS — M6281 Muscle weakness (generalized): Secondary | ICD-10-CM

## 2020-01-03 NOTE — Therapy (Addendum)
[facility] [place], [place], [address] Phone: [phone]   Fax:  [phone]  Physical Therapy Treatment PHYSICAL THERAPY DISCHARGE SUMMARY  Visits from Start of Care: 6  Current functional level related to goals / functional outcomes: See below   Remaining deficits: See goals   Education / Equipment: HEP Plan: Patient agrees to discharge.  Patient goals were not met. Patient is being discharged due to not returning since the last visit.  ?????     Patient Details  Name: Hailey Bates MRN: [id number] Date of Birth: [birth year] Referring Provider (PT): Noemi Chapel, [place]   Encounter Date: 01/03/2020   PT End of Session - 01/03/20 1141    Visit Number 6    Number of Visits 12    Date for PT Re-Evaluation 01/29/20    Authorization Type [employer]; Progress note every 10th visit    Authorization - Visit Number 6    Authorization - Number of Visits 12    PT Start Time 0945    PT Stop Time 1033    PT Time Calculation (min) 48 min    Activity Tolerance Patient tolerated treatment well    Behavior During Therapy [facility] for tasks assessed/performed           Past Medical History:  Diagnosis Date  . Hypertension   . Neuromuscular disorder [facility])    sciatica    Past Surgical History:  Procedure Laterality Date  . ANTERIOR AND POSTERIOR REPAIR  01/04/2011   Procedure: ANTERIOR (CYSTOCELE) AND POSTERIOR REPAIR (RECTOCELE);  Surgeon: Reece Packer, MD;  Location: [facility];  Service: Urology;  Laterality: N/A;  anterior repair/cysto, with graft,no posterior repair  . BLADDER SUSPENSION  01/04/2011   Procedure: [facility] PROCEDURE;  Surgeon: Reece Packer, MD;  Location: [facility];  Service: Urology;  Laterality: N/A;  . CARDIAC CATHETERIZATION    . CYSTOSCOPY  01/04/2011   Procedure: CYSTOSCOPY;  Surgeon: Reece Packer, MD;  Location: [facility];  Service: Urology;  Laterality: N/A;  . NASAL SINUS SURGERY     . VAGINAL HYSTERECTOMY  01/04/2011   Procedure: HYSTERECTOMY VAGINAL;  Surgeon: Lubertha South Romine;  Location: [facility];  Service: Gynecology;  Laterality: N/A;  . VAGINAL PROLAPSE REPAIR  01/04/2011   Procedure: VAGINAL VAULT SUSPENSION;  Surgeon: Reece Packer, MD;  Location: [facility];  Service: Urology;  Laterality: N/A;    There were no vitals filed for this visit.   Subjective Assessment - 01/03/20 1134    Subjective COVID-19 screening performed upon arrival. Patient reports ongoing weakness in L wrist and hand. Spoke with MD regarding seeing neurologist; spoke to a neurologist practice and stated they mainly perform surgeries.    Pertinent History HTN    Limitations Lifting;House hold activities    Diagnostic tests x-ray: shoulder arthritis    Patient Stated Goals improve movement decrease pain.    Currently in Pain? No/denies              [facility] PT Assessment - 01/03/20 0001      Assessment   Medical Diagnosis left radial nerve entrapment    Referring Provider (PT) Noemi Chapel, PA-C    Onset Date/Surgical Date 10/16/19    Hand Dominance Right    Next MD Visit December    Prior Therapy no      Precautions   Precautions None  [facility] Adult PT Treatment/Exercise - 01/03/20 0001      Elbow Exercises   Forearm Supination AROM;Left;20 reps;Seated    Forearm Supination Limitations green therabar    Forearm Pronation AROM;Left;20 reps;Seated    Forearm Pronation Limitations green therabar      Hand Exercises   Digiticizer yellow digitizer, x10 each finger followed by full grip. x20    Other Hand Exercises Red web for grip strength x20      Wrist Exercises   Wrist Flexion Left;20 reps;Seated;AROM    Wrist Extension Left;20 reps;Seated;AROM    Other wrist exercises therabar wrist flexion and extension      Manual Therapy   Manual Therapy Neural Stretch    Neural Stretch passive radial and ulnar nerve glides                        PT Long Term Goals - 12/19/19 1039      PT LONG TERM GOAL #1   Title Patient will be independent with HEP and its progression    Time 6    Period Weeks    Status On-going      PT LONG TERM GOAL #2   Title Patient will report a centralization of left UE neurological symptoms to indicate decrease nerve irritation.    Baseline ongoing symptoms 12/19/19    Time 6    Period Weeks    Status On-going      PT LONG TERM GOAL #3   Title Patient will demonstrate 4/5 left shoulder and elbow MMT to improve stability during functional tasks.    Time 6    Period Weeks    Status On-going      PT LONG TERM GOAL #4   Title Patinet will report ability to peform ADLs and home tasks with left UE pain less than or equal to 2/10.    Baseline pain over 4/10 12/19/19    Time 6    Period Weeks    Status On-going                 Plan - 01/03/20 1135    Clinical Impression Statement Patient tolerated treatment fairly well but with notable weakness with left wrist extension. Patient with significant compensatory motions with 1# dumbbell TEs therefore exercises regressed to AROM. Passive nerve glides performed with no adverse affects. Patient and PT discussed calling referring doctor to possibly move up her follow up. No adverse affects upon removal of modalities.    Personal Factors and Comorbidities Comorbidity 1    Comorbidities HTN    Examination-Activity Limitations Reach Overhead;Dressing    Stability/Clinical Decision Making Stable/Uncomplicated    Clinical Decision Making Low    Rehab Potential Good    PT Frequency 2x / week    PT Duration 6 weeks    PT Treatment/Interventions ADLs/Self Care Home Management;Electrical Stimulation;Ultrasound;Moist Heat;Therapeutic exercise;Neuromuscular re-education;Manual techniques;Passive range of motion;Patient/family education    PT Next Visit Plan cont with POC; UBE, radial and ulnar nerve glides, elbow flexion/extension to  tolerance, wrist flexion/extension and pronation/supination to tolerance. Modalities PRN for pain relief    PT Home Exercise Plan see patient education section    Consulted and Agree with Plan of Care Patient           Patient will benefit from skilled therapeutic intervention in order to improve the following deficits and impairments:  Decreased range of motion, Decreased strength, Impaired UE functional use, Pain, Impaired sensation, Decreased activity tolerance  Visit Diagnosis: Pain in left arm  Muscle weakness (generalized)     Problem List There are no problems to display for this patient.   Gabriela Eves, PT, DPT 01/03/2020, 11:46 AM  [facility] [address] [place], [place], [address] Phone: [phone]   Fax:  [phone]  Name: Hailey Bates MRN: [id number] Date of Birth: 08-19-1955

## 2020-01-08 ENCOUNTER — Ambulatory Visit: Payer: Medicare PPO | Admitting: Physical Therapy

## 2020-01-10 ENCOUNTER — Encounter: Payer: Medicare PPO | Admitting: Physical Therapy

## 2020-03-17 ENCOUNTER — Encounter: Payer: Self-pay | Admitting: Neurology

## 2020-03-18 ENCOUNTER — Other Ambulatory Visit: Payer: Self-pay

## 2020-03-18 DIAGNOSIS — R202 Paresthesia of skin: Secondary | ICD-10-CM

## 2020-04-29 ENCOUNTER — Ambulatory Visit (INDEPENDENT_AMBULATORY_CARE_PROVIDER_SITE_OTHER): Payer: Medicare PPO | Admitting: Neurology

## 2020-04-29 ENCOUNTER — Other Ambulatory Visit: Payer: Self-pay

## 2020-04-29 DIAGNOSIS — R202 Paresthesia of skin: Secondary | ICD-10-CM | POA: Diagnosis not present

## 2020-04-29 DIAGNOSIS — G629 Polyneuropathy, unspecified: Secondary | ICD-10-CM

## 2020-04-29 NOTE — Procedures (Signed)
[facility]  [place], [place]  [place], [place] [address] Tel: [phone] Fax:  [phone] Test Date:  04/29/2020  Patient: Hailey Bates DOB: January 27, 1956 Physician: Narda Amber, DO  Sex: Female Height: 5\' 3"  Ref Phys: Hiram Gash, MD  ID#: [id number]   Technician:    Patient Complaints: This is a 65 year old female referred for evaluation of left arm pain and paresthesias.  NCV & EMG Findings: Extensive electrodiagnostic testing of the left upper extremity and additional studies of the right shows:  1. Bilateral median and ulnar sensory responses show prolonged latency (L4.2, R5.4, L3.9, R3.5 ms).  Left radial sensory responses are absent and right radial sensory response shows prolonged latency (3.3 ms) and reduced amplitude (8.0 V).   2. Left ulnar motor response shows prolonged latency and slowed conduction velocity across the forearm.  Left radial motor response shows reduced amplitude and slowed conduction velocity across the forearm right radial sensory response shows normal amplitude, however, there is slowed conduction velocity.  Bilateral median motor responses are within normal limits.  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 3. Diffuse chronic motor axonal loss changes are seen affecting the level of the tested muscles extremities especially in the forearm and hand.  These findings are present in the right upper extremity.  There is no evidence of a complete active denervation.  Impression: This is a complex study.  The electrophysiologic findings are most suggestive of a chronic sensorimotor neuropathy, with axonal and demyelinating features, affecting the upper extremities.  Formal neurological consultation is recommended.   ___________________________ Narda Amber, DO    Nerve Conduction Studies Anti  Sensory Summary Table   Stim Site NR Peak (ms) Norm Peak (ms) P-T Amp (V) Norm P-T Amp  Left Median Anti Sensory (2nd Digit)  32C  Wrist    4.2 <3.8 12.4 >10  Right Median Anti Sensory (2nd Digit)  32C  Wrist    5.4 <3.8 14.0 >10  Left Radial Anti Sensory (Base 1st Digit)  32C  Wrist NR  <2.8  >10  Right Radial Anti Sensory (Base 1st Digit)  32C  Wrist    3.3 <2.8 8.0 >10  Left Ulnar Anti Sensory (5th Digit)  32C  Wrist    3.9 <3.2 13.4 >5  Right Ulnar Anti Sensory (5th Digit)  32C  Wrist    3.5 <3.2 16.5 >5   Motor Summary Table   Stim Site NR Onset (ms) Norm Onset (ms) O-P Amp (mV) Norm O-P Amp Site1 Site2 Delta-0 (ms) Dist (cm) Vel (m/s) Norm Vel (m/s)  Left Median Motor (Abd Poll Brev)  32C  Wrist    3.1 <4.0 3.3 >5 Elbow Wrist 5.0 26.0 52 >50  Elbow    8.1  4.5  Ulnar-wristcrossover Elbow 3.7 0.0    Ulnar-wristcrossover    4.4  9.5         Right Median Motor (Abd Poll Brev)  32C  Wrist    4.0 <4.0 6.8 >5 Elbow Wrist 4.8 26.0 54 >50  Elbow    9.0  6.6  Ulnar-wrist crossover Elbow 4.6 0.0    Ulnar-wrist crossover    4.4  9.1         Left Radial Motor (Ext Ind Prop)  32C  7cm    2.2 <3.1 3.9 >5 ACF 7cm 2.1 9.0 43 >50  ACF  4.3  3.3  B-elbow ACF 1.5 8.0 53   B-elbow    5.8  3.3  A-elbow B-elbow 0.4 3.0 65   A-elbow    6.2  3.3         Right Radial Motor (Ext Ind Prop)  32C  7cm    2.0 <3.1 7.2 >5 ACF 7cm 2.0 9.0 45 >50  ACF    4.0  6.5  B-elbow ACF 0.8 8.0 60   B-elbow    4.8  6.6         A-elbow    5.2  6.4         Left Ulnar Motor (Abd Dig Minimi)  32C  Wrist    3.4 <3.1 10.7 >7 B Elbow Wrist 4.6 21.0 46 >50  B Elbow    8.0  9.1  A Elbow B Elbow 1.4 10.0 61 >50  A Elbow    9.4  8.7  Median-elbow  A Elbow 0.2 0.0    Median-elbow     9.2  6.8         Right Ulnar Motor (Abd Dig Minimi)  32C  Wrist    3.0 <3.1 10.9 >7 B Elbow Wrist 3.1 22.0 61 >50  B Elbow    6.1  9.8  A Elbow B Elbow 1.7 10.0 59 >50  A Elbow    7.8  9.8          EMG   Side Muscle Ins  Act Fibs Psw Fasc Number Recrt Dur Dur. Amp Amp. Poly Poly. Comment  Left PronatorTeres Nml Nml Nml Nml 1- Rapid Many 1+ Many 1+ Many 1+ N/A  Left Ext Indicis Nml Nml Nml Nml 1- Rapid Many 1+ Many 1+ Many 1+ N/A  Left ABD Dig Min Nml Nml Nml Nml 2- Rapid Many 1+ Many 1+ Many 1+ N/A  Left FlexCarpiUln Nml Nml Nml Nml 1- Rapid Many 1+ Many 1+ Many 1+ N/A  Left ExtCarRadLong Nml Nml Nml Nml 1- Rapid Many 1+ Many 1+ Many 1+ N/A  Left ExtCarUln Nml Nml Nml Nml 1- Rapid Many 1+ Many 1+ Many 1+ N/A  Left BrachioRad Nml Nml Nml Nml 1- Rapid Many 1+ Many 1+ Many 1+ N/A  Left Abd Poll Brev Nml Nml Nml Nml 1- Rapid Many 1+ Many 1+ Many 1+ N/A  Left Biceps Nml Nml Nml Nml Nml Nml Nml Nml Nml Nml Nml Nml N/A  Left Triceps Nml Nml Nml Nml Nml Nml Nml Nml Nml Nml Nml Nml N/A  Left Deltoid Nml Nml Nml Nml 2- Rapid Some 1+ Some 1+ Some 1+ N/A  Left 1stDorInt Nml Nml Nml Nml 1- Rapid Some 1+ Some 1+ Some 1+ N/A  Right 1stDorInt Nml Nml Nml Nml 1- Rapid Many 1+ Many 1+ Many 1+ N/A  Right Ext Indicis Nml Nml Nml Nml 1- Rapid Many 1+ Many 1+ Many 1+ N/A  Right ABD Dig Min Nml Nml Nml Nml 2- Rapid Many 1+ Many 1+ Many 1+ N/A  Right Abd Poll Brev Nml Nml Nml Nml 1- Rapid Many 1+ Many 1+ Many 1+ N/A      Waveforms:

## 2020-05-11 ENCOUNTER — Other Ambulatory Visit: Payer: Self-pay

## 2020-05-11 ENCOUNTER — Other Ambulatory Visit (INDEPENDENT_AMBULATORY_CARE_PROVIDER_SITE_OTHER): Payer: Medicare PPO

## 2020-05-11 ENCOUNTER — Ambulatory Visit: Payer: Medicare PPO | Admitting: Neurology

## 2020-05-11 ENCOUNTER — Encounter: Payer: Self-pay | Admitting: Neurology

## 2020-05-11 VITALS — BP 112/79 | HR 64 | Ht 64.0 in | Wt 175.0 lb

## 2020-05-11 DIAGNOSIS — G629 Polyneuropathy, unspecified: Secondary | ICD-10-CM | POA: Diagnosis not present

## 2020-05-11 DIAGNOSIS — G540 Brachial plexus disorders: Secondary | ICD-10-CM

## 2020-05-11 LAB — SEDIMENTATION RATE: Sed Rate: 33 mm/hr — ABNORMAL HIGH (ref 0–30)

## 2020-05-11 LAB — C-REACTIVE PROTEIN: CRP: <1 mg/dL (ref 0.5–20.0)

## 2020-05-11 LAB — TSH: TSH: 0.98 u[IU]/mL (ref 0.35–4.50)

## 2020-05-11 MED ORDER — GABAPENTIN 300 MG PO CAPS: 300.0000 mg | ORAL_CAPSULE | Freq: Three times a day (TID) | ORAL | 3 refills | Status: DC

## 2020-05-11 NOTE — Progress Notes (Signed)
[facility] Clinic Note - Initial Visit   Date: 05/11/20  SEAIRRA OTANI MRN: [id number] DOB: November 03, 1955   Dear Dr. Griffin Basil:  Thank you for your kind referral of JANAH MCCULLOH for consultation of left hand weakness and pain. Although her history is well known to you, please allow [place] to reiterate it for the purpose of our medical record. The patient was accompanied to the clinic by self.    History of Present Illness: MONICK RENA is a 65 y.o. right-handed female with hypertension and insomnia presenting for evaluation of left arm pain and weakness.   In September 2021, she woke up in the middle of the night with left proximal arm throbbing pain.  She was given steroid injection for possible muscle strain. Because of unrelenting pain, she was given 5-days of prednisone which helped her pain when taking the steroids, but then returned once she stopped it.  She was seen by orthopeadics who felt she may have high nerve entrapment. She completed physical therapy which did not help her symptoms.  By November, she was having tingling and sharp pain over upper lateral arm, forearm, and dorsum of the thumb, index finger, and middle finger.  She has some improvement in pain with gabapentin 376m three times daily, which significantly helps the pain.  She has weakness in the left hand and arm. MRI cervical spine shows cervical spondylosis with neural foraminal stenosis at C5-6 bilaterally, which would not explain symptoms, so she was referred for EMG.  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  She is here to discuss these results.   Of note, since February, she feels that her pain and weakness has stabilized and there has been no further progression.   Her brother and mother had burning pain in the feet, neither are diabetic.  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  [place]  No history of diabetes or heavy alcohol use.    Out-side paper records, electronic medical record, and images have been reviewed where available and summarized as:  NCS/EMG of the arms 04/29/2020: This is a complex study.  The electrophysiologic findings are most suggestive of a chronic sensorimotor neuropathy, with axonal and demyelinating features, affecting the upper extremities.  Formal neurological consultation is recommended.  MRI cervical spine wo contrast 02/01/2020:  Cervical spondylosis, moderate disc bulge at C5-6 with bilateral disc osteophyte hypertrophy; mod/severe bilateral neural foraminal stenosis   Past Medical History:  Diagnosis Date  . Hypertension   . Neuromuscular disorder ([facility]    sciatica    Past Surgical History:  Procedure Laterality Date  . ANTERIOR AND POSTERIOR REPAIR  01/04/2011   Procedure: ANTERIOR (CYSTOCELE) AND POSTERIOR REPAIR (RECTOCELE);  Surgeon: SReece Packer MD;  [facility];  Service: Urology;  Laterality: N/A;  anterior repair/cysto, with graft,no posterior repair  . BLADDER SUSPENSION  01/04/2011   [facility];  Surgeon: SReece Packer MD;  [place];  Service: Urology;  Laterality: N/A;  . CARDIAC CATHETERIZATION    . CYSTOSCOPY  01/04/2011   Procedure: CYSTOSCOPY;  Surgeon: SReece Packer MD;  [place] [facility];  Service: Urology;  Laterality: N/A;  . NASAL SINUS SURGERY    . VAGINAL HYSTERECTOMY  01/04/2011   Procedure: HYSTERECTOMY VAGINAL;  Surgeon: CLubertha SouthRomine;  [facility];  Service: Gynecology;  Laterality: N/A;  .  VAGINAL PROLAPSE REPAIR  01/04/2011   Procedure: VAGINAL VAULT SUSPENSION;  Surgeon: Reece Packer, MD;  [facility];  Service: Urology;  Laterality: N/A;     Medications:  Outpatient Encounter Medications as of 05/11/2020  Medication Sig  . amLODipine-olmesartan (AZOR) 5-20 MG tablet Take 1 tablet by  mouth daily.  . cyclobenzaprine (FLEXERIL) 10 MG tablet Take 10 mg by mouth daily as needed. Takes for muscle spasms  . gabapentin (NEURONTIN) 300 MG capsule TAKE ONE (1) CAPSULE THREE (3) TIMES EACH DAY  . HYDROcodone-acetaminophen (NORCO) 5-325 MG per tablet Take 1 tablet by mouth every 6 (six) hours as needed. Takes for pain  . zolpidem (AMBIEN) 10 MG tablet Take 10 mg by mouth at bedtime.  Marland Kitchen amitriptyline (ELAVIL) 25 MG tablet Take 25 mg by mouth at bedtime.   (Patient not taking: Reported on 05/11/2020)  . naproxen (NAPROSYN) 500 MG tablet Take 500 mg by mouth daily.   (Patient not taking: Reported on 05/11/2020)   No facility-administered encounter medications on file as of 05/11/2020.    Allergies: No Known Allergies  Family History: Family History  Problem Relation Age of Onset  . Hypertension Mother   . Diabetes Father   . Hypertension Father     Social History: Social History   Tobacco Use  . Smoking status: Never Smoker  . Smokeless tobacco: Never Used  Vaping Use  . Vaping Use: Never used  Substance Use Topics  . Alcohol use: Yes    Comment: rarely  . Drug use: No   Social History   Social History Narrative   Right Handed    Lives in a one story home   Drinks Caffeine     Vital Signs:  BP 112/79   Pulse 64   Ht _0  (1.626 m)   Wt 175 lb (79.4 kg)   SpO2 98%   BMI 30.04 kg/m    Neurological Exam: MENTAL STATUS including orientation to time, place, person, recent and remote memory, attention span and concentration, language, and fund of knowledge is normal.  Speech is not dysarthric.  CRANIAL NERVES: II:  No visual field defects.    III-IV-VI: Pupils equal round and reactive to light.  Normal conjugate, extra-ocular eye movements in all directions of gaze.  No nystagmus.  No ptosis.   V:  Normal facial sensation.    VII:  Normal facial symmetry and movements.   VIII:  Normal hearing and vestibular function.   IX-X:  Normal palatal movement.   XI:   Normal shoulder shrug and head rotation.   XII:  Normal tongue strength and range of motion, no deviation or fasciculation.  MOTOR:  No atrophy, fasciculations or abnormal movements.  No pronator drift.   Upper Extremity:  Right  Left  Deltoid  5/5   5-/5   Biceps  5/5   5-/5   Triceps  5/5   5-/5   Infraspinatus 5/5  5-/5  Medial pectoralis 5/5  5-/5  Wrist extensors  5/5   4/5   Wrist flexors  5/5   4/5   Finger extensors  5/5   4/5   Finger flexors  5/5   4/5   Dorsal interossei  5/5   4/5   Abductor pollicis  5/5   4/5   Tone (Ashworth scale)  0  0   Lower Extremity:  Right  Left  Hip flexors  5/5   5/5   Hip extensors  5/5   5/5  Adductor 5/5  5/5  Abductor 5/5  5/5  Knee flexors  5/5   5/5   Knee extensors  5/5   5/5   Dorsiflexors  5/5   5/5   Plantarflexors  5/5   5/5   Toe extensors  5/5   5/5   Toe flexors  5/5   5/5   Tone (Ashworth scale)  0  0   MSRs:  Right        Left                  brachioradialis 2+  2+  biceps 2+  2+  triceps 2+  2+  patellar 2+  2+  ankle jerk 2+  2+  Hoffman no  no  plantar response down  down   SENSORY:  Normal and symmetric perception of light touch, pinprick, vibration, and proprioception.  Romberg's sign absent.   COORDINATION/GAIT: Normal finger-to- nose-finger.  Intact rapid alternating movements bilaterally.  Able to rise from a chair without using arms.  Gait narrow based and stable. Mild unsteadiness with tandem and stressed gait.    IMPRESSION: Left >> right arm paresthesias and pain.  By history, [place] Turner syndrome is considered, although it would be atypical to have EMG findings also in the right arm.  I will check MRI brachial plexus and screen for neuropathy labs.  If this is not diagnostic, the next step is CSF testing to evaluate for a polyradiculoneuropathy.  PLAN/RECOMMENDATIONS:  Check ESR, CRP, vitamin B12, folate, copper, SPEP with IFE, TSH, ANA MRI brachial plexus (left side) Continue gabapentin  331m three times daily  Patient educated on daily foot inspection, fall prevention, and safety precautions around the home.  Total time spent reviewing records, interview, history/exam, documentation, and coordination of care on day of encounter:  50 min     Thank you for allowing me to participate in patient's care.  If I can answer any additional questions, I would be pleased to do so.    Sincerely,    Daveon Arpino K. PPosey Pronto DO

## 2020-05-11 NOTE — Patient Instructions (Addendum)
Check labs  MRI brachial plexus   We will let you know the next steps

## 2020-05-12 LAB — B12 AND FOLATE PANEL
Folate: >23.6 ng/mL (ref >5.9)
Vitamin B-12: >1506 pg/mL — ABNORMAL HIGH (ref 211–911)

## 2020-05-13 ENCOUNTER — Telehealth: Payer: Self-pay

## 2020-05-13 LAB — PROTEIN ELECTROPHORESIS, SERUM
Albumin ELP: 4.4 g/dL (ref 3.8–4.8)
Alpha 1: 0.3 g/dL (ref 0.2–0.3)
Alpha 2: 0.8 g/dL (ref 0.5–0.9)
Beta 2: 0.4 g/dL (ref 0.2–0.5)
Beta Globulin: 0.5 g/dL (ref 0.4–0.6)
Gamma Globulin: 1 g/dL (ref 0.8–1.7)
Total Protein: 7.4 g/dL (ref 6.1–8.1)

## 2020-05-13 LAB — COPPER, SERUM: Copper: 122 ug/dL (ref 70–175)

## 2020-05-13 LAB — IMMUNOFIXATION ELECTROPHORESIS
IgG (Immunoglobin G), Serum: 1113 mg/dL (ref 600–1540)
IgM, Serum: 51 mg/dL (ref 50–300)
Immunofix Electr Int: NOT DETECTED
Immunoglobulin A: 284 mg/dL (ref 70–320)

## 2020-05-13 LAB — ANA: Anti Nuclear Antibody (ANA): NEGATIVE

## 2020-05-13 NOTE — Telephone Encounter (Signed)
Called patient and informed her of normal labs. Patient stated she is scheduled for her MRI on Monday. Patient had no questions or concerns.

## 2020-05-13 NOTE — Telephone Encounter (Signed)
-----   Message from Alda Berthold, DO sent at 05/13/2020  1:28 PM EDT ----- Please notify patient lab are within normal limits.  Thank you.

## 2020-05-18 ENCOUNTER — Ambulatory Visit
Admission: RE | Admit: 2020-05-18 | Discharge: 2020-05-18 | Disposition: A | Discharge status: Home / Self Care | Payer: Medicare PPO | Source: Ambulatory Visit | Attending: Neurology | Admitting: Neurology

## 2020-05-18 ENCOUNTER — Other Ambulatory Visit: Payer: Self-pay

## 2020-05-18 DIAGNOSIS — G629 Polyneuropathy, unspecified: Secondary | ICD-10-CM

## 2020-05-18 DIAGNOSIS — G540 Brachial plexus disorders: Secondary | ICD-10-CM

## 2020-05-18 IMAGING — MR MR CHEST MEDIASTINUM WO/W CM
10 series · 16 of 16 positions shown · IV contrast (16 ML MULTIHANCE)
Comparison: MRI cervical spine [DATE].

CLINICAL DATA: Left arm numbness and weakness in [DATE].

EXAM:
MRI BRACHIAL PLEXUS WITHOUT AND WITH CONTRAST
TECHNIQUE: Multiplanar, multiecho pulse sequences of the neck and surrounding
structures were obtained without and with intravenous contrast. The
field of view was focused on the LEFT brachial plexus from the
neural foramina to the axilla.
CONTRAST:  16 mL MULTIHANCE GADOBENATE DIMEGLUMINE 529 MG/ML IV SOLN

[Series 2: T1 · axial · 3.0mm · 1.12mm/px · 1 of 40 slices shown (1 of 3)]
[im 1/40]
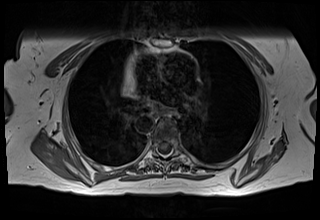

[Series 3: T2 fat-sat · axial · 3.0mm · 1.12mm/px · 1 of 40 slices shown]
[im 1/40]
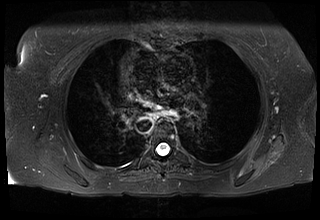

[Series 4: T1 · coronal · 3.0mm · 0.94mm/px · 1 of 37 slices shown (2 of 3)]
[im 1/37]
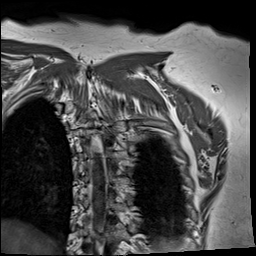

[Series 5: STIR · coronal · 3.0mm · 0.94mm/px · 1 of 37 slices shown]
[im 1/37]
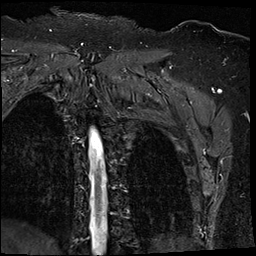

[Series 6: T1 · sagittal · 3.0mm · 0.75mm/px · 2 of 40 slices shown (3 of 3)]
[im 1/40]
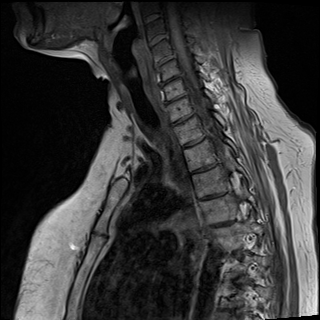
[im 40/40]
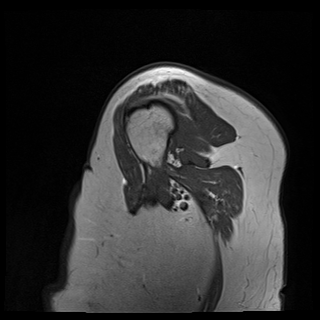

[Series 7: T2 · sagittal · 3.0mm · 0.75mm/px · 2 of 40 slices shown]
[im 1/40]
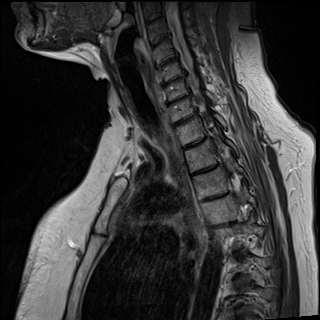
[im 40/40]
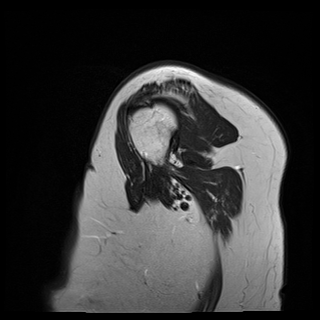

[Series 8: T1 fat-sat · axial · non-contrast · 3.0mm · 1.12mm/px · z∈[-57,+98]mm · 2 of 40 slices shown]
[im 1/40]
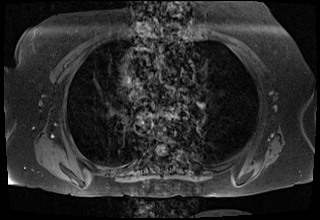
[im 40/40]
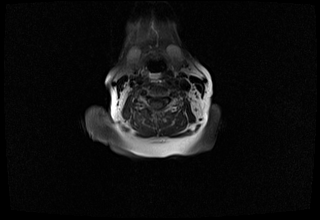

[Series 9: T1 fat-sat post-contrast · axial · 3.0mm · 1.12mm/px · z∈[-57,+98]mm · 2 of 40 slices shown (1 of 3)]
[im 1/40]
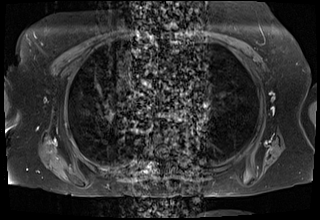
[im 40/40]
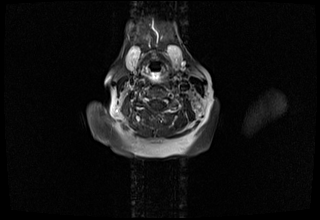

[Series 10: T1 fat-sat post-contrast · sagittal · 3.0mm · 0.75mm/px · 2 of 40 slices shown (2 of 3)]
[im 1/40]
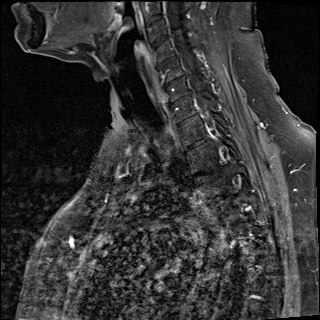
[im 40/40]
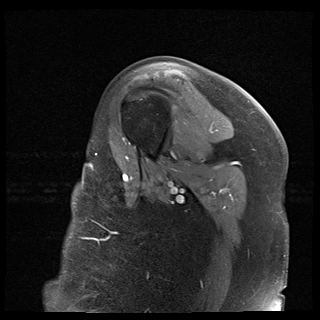

[Series 11: T1 fat-sat post-contrast · coronal · 3.0mm · 0.75mm/px · 2 of 37 slices shown (3 of 3)]
[im 1/37]
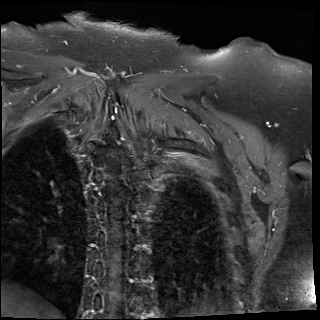
[im 37/37]
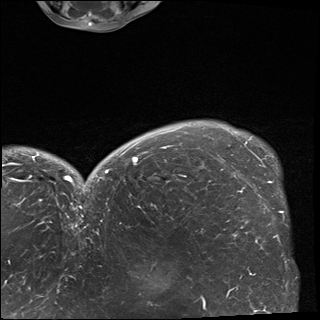

[16 of 16 positions shown; findings below may reference images not displayed]

FINDINGS: Spinal cord

Patient motion somewhat limits evaluation but no signal abnormality
is identified. The patient has multilevel degenerative disc disease
of the cervical spine which appears worst at C5-6 and C6-7. The
appearance is unchanged.

Brachial plexus:

Normal in appearance. No mass involving the brachial plexus is
identified. No fluid collection or mass impinging on the brachial
plexus is seen.

Muscles and tendons

No evidence of denervation is seen. Musculature is preserved without
atrophy or focal lesion.

Bones

No fracture or focal lesion is identified.

Joints

Negative.

Other findings

The patient has a right aortic arch.
IMPRESSION: Normal appearing brachial plexus.

Multilevel cervical spondylosis as seen on prior cervical spine MRI.

Right aortic arch.

## 2020-05-18 MED ORDER — GADOBENATE DIMEGLUMINE 529 MG/ML IV SOLN
16.0000 mL | Freq: Once | INTRAVENOUS | Status: AC | PRN
  Administered 2020-05-18: 16 mL via INTRAVENOUS

## 2020-05-19 ENCOUNTER — Telehealth: Payer: Self-pay | Admitting: Neurology

## 2020-05-19 DIAGNOSIS — R29898 Other symptoms and signs involving the musculoskeletal system: Secondary | ICD-10-CM

## 2020-05-19 DIAGNOSIS — G610 Guillain-Barre syndrome: Secondary | ICD-10-CM

## 2020-05-19 DIAGNOSIS — R202 Paresthesia of skin: Secondary | ICD-10-CM

## 2020-05-19 NOTE — Telephone Encounter (Signed)
Patient called in after missing a call

## 2020-05-19 NOTE — Telephone Encounter (Signed)
Called patient to discuss results of MRI, but there was no answer.  Message left on voicemail.  If patient returns call, please let her know that her MRI of the brachial plexus is normal.  I recommend checking for inflammation in the spinal fluid with doing a lumbar puncture.  If she is agreeable, please order LP.  Check CSF cell count and diff, protein, glucose, IgG index, oligoclonal bands, flow cytology, ACE.   Hailey Bates K. Posey Pronto, DO

## 2020-05-20 ENCOUNTER — Telehealth: Payer: Self-pay | Admitting: Neurology

## 2020-05-20 DIAGNOSIS — G610 Guillain-Barre syndrome: Secondary | ICD-10-CM

## 2020-05-20 NOTE — Telephone Encounter (Signed)
Called patient and left a message for a call back.  

## 2020-05-20 NOTE — Telephone Encounter (Signed)
Patient was informed that her MRI of the brachial plexus is normal.  I recommend CSF testing as the next step to evaluate for CIDP.   We will order lumbar spine - check CSF cell count and diff, protein, glucose, IgG index, oligoclonal bands, flow cytology, ACE.  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   Orion Vandervort K. Posey Pronto, DO

## 2020-05-21 NOTE — Addendum Note (Signed)
Addended by: Armen Pickup A on: 05/21/2020 12:09 PM   Modules accepted: Orders

## 2020-05-21 NOTE — Telephone Encounter (Signed)
Called and spoke to patient and she informed me that she has spoken to Dr. Posey Pronto in regards to her MRI.   Patient stated she was called from scheduling and was asked if she had a MRI of the brain done? patient stated no, and was informed that before they can do the Lumbar she will need a MRI of her brain. Informed patient that I would ask Dr. Posey Pronto and get back to her.   Also patient wanted to ask Dr Posey Pronto if her Radial Intrapment could be the issue?

## 2020-05-21 NOTE — Telephone Encounter (Signed)
OK to order MRI brain without contrast.    Her nerve testing shows diffuse nerve involvement, extending outside of the radial nerve, so it is unlikely to be this.

## 2020-05-21 NOTE — Addendum Note (Signed)
Addended by: Armen Pickup A on: 05/21/2020 02:14 PM   Modules accepted: Orders

## 2020-05-21 NOTE — Telephone Encounter (Signed)
Called patient and informed her that MRI order has been sent and once PA is completed [facility] will contact her. Also informed patient about her Radial Nerve concerns per Dr. Posey Pronto. Patient verbalized understanding and had no questions or concerns.

## 2020-06-10 ENCOUNTER — Other Ambulatory Visit: Payer: Medicare PPO

## 2020-08-24 DIAGNOSIS — M79602 Pain in left arm: Secondary | ICD-10-CM | POA: Insufficient documentation

## 2020-09-14 DIAGNOSIS — M79641 Pain in right hand: Secondary | ICD-10-CM | POA: Insufficient documentation

## 2020-09-16 DIAGNOSIS — G5601 Carpal tunnel syndrome, right upper limb: Secondary | ICD-10-CM | POA: Insufficient documentation

## 2020-10-23 DIAGNOSIS — G5631 Lesion of radial nerve, right upper limb: Secondary | ICD-10-CM | POA: Insufficient documentation

## 2020-10-28 ENCOUNTER — Telehealth: Payer: Self-pay | Admitting: Neurology

## 2020-10-28 NOTE — Telephone Encounter (Signed)
I spoke with patient and she is going to contact her PCP for further evaluation.

## 2020-10-28 NOTE — Telephone Encounter (Signed)
I am sorry to hear this.  Unfortunately, there is no much I can do, since we do not manage pain and have not evaluated her for this.   Please inform patient that she needs to follow-up with either her PCP, orthopaedics, or urgent care for pain and psychological support.  It looks like her orthopeadic surgeon, Dr. Amedeo Plenty has ordered her EMG which is scheduled on 10/13.

## 2020-10-28 NOTE — Telephone Encounter (Signed)
Pt called in stating she fell 09/23/20 and her right arm caught the night stand. She says she has been to multiple physicians and nothing is broken. She is scheduled to have an EMG on 11/12/20 for this arm. She says she is having extreme pain and has tried tylenol, and oxycodone and "nothing will touch it". She says she is not very well psychologically due to this and how much pain she is in.

## 2020-11-10 ENCOUNTER — Other Ambulatory Visit: Payer: Self-pay

## 2020-11-10 DIAGNOSIS — R202 Paresthesia of skin: Secondary | ICD-10-CM

## 2020-11-12 ENCOUNTER — Other Ambulatory Visit: Payer: Self-pay

## 2020-11-12 ENCOUNTER — Ambulatory Visit (INDEPENDENT_AMBULATORY_CARE_PROVIDER_SITE_OTHER): Payer: Medicare PPO | Admitting: Neurology

## 2020-11-12 DIAGNOSIS — R202 Paresthesia of skin: Secondary | ICD-10-CM | POA: Diagnosis not present

## 2020-11-12 DIAGNOSIS — G629 Polyneuropathy, unspecified: Secondary | ICD-10-CM

## 2020-11-13 NOTE — Procedures (Signed)
[facility]  [address]  [place], [place] [address] Tel: [phone] Fax:  [phone] Test Date:  11/12/2020  Patient: Hailey Bates DOB: 02-Feb-1955 Physician: Narda Amber, DO  Sex: Female Height: 5\' 4"  Ref Phys: Roseanne Kaufman, MD  ID#: [id number]   Technician:    Patient Complaints: This is a 65 year old female referred for evaluation of right hand numbness, tingling, and weakness following a fall.  She has had prior electrodiagnostic testing performed here on April 29, 2020 for left arm numbness and weakness which showed diffuse sensorimotor neuropathy with axonal and demyelinating changes.   NCV & EMG Findings: Extensive electrodiagnostic testing of the right upper extremity and additional studies of the left shows:  Right median sensory response shows prolonged latency (5.0 ms) and reduced amplitude (4.3 V), which is new from prior study.  Left median and ulnar sensory responses are prolonged (4.2, 3.4 ms).  Bilateral radial sensory responses are absent. Right median motor response shows prolonged latency (4.2 ms) and severely reduced reduced amplitude (0.8 mV), which is new.  Left median motor response is now within normal limits and previously abnormal.  Right ulnar and radial motor responses are essentially absent.  Left ulnar motor response shows prolonged latency (3.8 ms) and normal amplitude.  Left radial motor response shows reduced amplitude and slowed conduction velocity. Despite maximal activation, no motor unit recruitment was seen in the right first dorsal interosseous, abductor digiti minimi, and extensor indicis proprius muscles.  Chronic motor axonal loss changes are present in the right abductor pollicis brevis muscle.  In the left upper extremity, diffuse chronic motor axonal loss changes are seen affecting nearly all the tested muscles of the left upper extremity, sparing ulnar innervated muscles.  Impression: This is a complex study.  The  electrophysiologic findings are most suggestive of an asymmetric sensorimotor polyneuropathy, with axonal and demyelinating features, affecting the upper extremities (worse on the right), which has progressed compared to study on 04/29/2020.  Recommend further evaluation for polyradiculoneuropathy.   ___________________________ Narda Amber, DO    Nerve Conduction Studies Anti Sensory Summary Table   Stim Site NR Peak (ms) Norm Peak (ms) P-T Amp (V) Norm P-T Amp  Left Median Anti Sensory (2nd Digit)  32C  Wrist    4.2 <3.8 13.5 >10  Right Median Anti Sensory (2nd Digit)  32C  Wrist    5.0 <3.8 4.3 >10  Left Radial Anti Sensory (Base 1st Digit)  32C  Wrist NR  <2.8  >10  Right Radial Anti Sensory (Base 1st Digit)  32C  Wrist NR  <2.8  >10  Left Ulnar Anti Sensory (5th Digit)  32C  Wrist    3.4 <3.2 11.2 >5  Right Ulnar Anti Sensory (5th Digit)  32C  Wrist NR  <3.2  >5   Motor Summary Table   Stim Site NR Onset (ms) Norm Onset (ms) O-P Amp (mV) Norm O-P Amp Site1 Site2 Delta-0 (ms) Dist (cm) Vel (m/s) Norm Vel (m/s)  Left Median Motor (Abd Poll Brev)  32C  Wrist    3.7 <4.0 9.4 >5 Elbow Wrist 4.6 28.0 61 >50  Elbow    8.3  7.2         Right Median Motor (Abd Poll Brev)  32C  Wrist    4.2 <4.0 0.8 >5 Elbow Wrist 5.3 27.0 51 >50  Elbow    9.5  0.5  Ulnar-wristcrossover Elbow 5.0 0.0    Ulnar-wristcrossover    4.5  1.3  Left Radial Motor (Ext Ind Prop)  32C  7cm    2.0 <3.1 3.3 >5 Up Arm 7cm 2.1 7.0 33 >50  Up Arm    4.1  3.3  B-SpiralGroove Up Arm 1.4 5.0 36   B-SpiralGroove    5.5  3.3  A-SpiralGroove B-SpiralGroove 0.4 2.0 50   A-SpiralGroove    5.9  3.2         Right Radial Motor (Ext Ind Prop)  32C  7cm NR  <3.1  >5 Up Arm 7cm  3.0  >50  Up Arm NR     Axilla Up Arm  0.0    SG NR            Left Ulnar Motor (Abd Dig Minimi)  32C  Wrist    3.8 <3.1 10.3 >7 B Elbow Wrist 3.4 20.0 59 >50  B Elbow    7.2  8.6  A Elbow B Elbow 1.3 10.0 77 >50  A Elbow    8.5   8.6         Right Ulnar Motor (Abd Dig Minimi)  32C  Wrist NR  <3.1  >7 B Elbow Wrist  0.0  >50  B Elbow NR     A Elbow B Elbow  0.0  >50  A Elbow NR            Right Ulnar (FDI) Motor (1st DI)  32C  Wrist    4.0 <4.5 0.9 >7 B Elbow Wrist  0.0  >50  B Elbow NR     A Elbow B Elbow  0.0  >50  A Elbow NR             EMG   Side Muscle Ins Act Fibs Psw Fasc Number Recrt Dur Dur. Amp Amp. Poly Poly. Comment  Right 1stDorInt Nml Nml Nml Nml NE None - - - - - - N/A  Right Abd Poll Brev Nml Nml Nml Nml [facility] Rapid All 1+ All 1+ All 1+ N/A  Right ABD Dig Min Nml Nml Nml Nml NE None - - - - - - N/A  Right Ext Indicis Nml Nml Nml Nml NE None - - - - - - N/A  Right PronatorTeres Nml Nml Nml Nml Nml Nml Nml Nml Nml Nml Nml Nml N/A  Right Biceps Nml Nml Nml Nml Nml Nml Nml Nml Nml Nml Nml Nml N/A  Right Triceps Nml Nml Nml Nml Nml Nml Nml Nml Nml Nml Nml Nml N/A  Right Deltoid Nml Nml Nml Nml Nml Nml Nml Nml Nml Nml Nml Nml N/A  Right Cervical Parasp Low Nml Nml Nml Nml Nml - - - - - - - N/A  Left 1stDorInt Nml Nml Nml Nml Nml Nml Nml Nml Nml Nml Nml Nml N/A  Left Ext Indicis Nml Nml Nml Nml 2- Rapid Many 1+ Many 1+ Many 1+ N/A  Left PronatorTeres Nml Nml Nml Nml 1- Rapid Some 1+ Some 1+ Some 1+ N/A  Left Biceps Nml Nml Nml Nml 1- Rapid Some 1+ Some 1+ Some 1+ N/A  Left Triceps Nml Nml Nml Nml 1- Rapid Some 1+ Some 1+ Some 1+ N/A  Left Deltoid Nml Nml Nml Nml 1- Rapid Some 1+ Some 1+ Some 1+ N/A  Left ABD Dig Min Nml Nml Nml Nml Nml Nml Nml Nml Nml Nml Nml Nml N/A      Waveforms:

## 2020-11-17 ENCOUNTER — Telehealth: Payer: Self-pay | Admitting: Neurology

## 2020-11-17 NOTE — Telephone Encounter (Signed)
I would like to evaluate and discuss results in the office. Please offer follow-up next week. Thanks.

## 2020-11-17 NOTE — Telephone Encounter (Signed)
Patient called to follow up on test results from her EMG done 11/12/20 and to see what the next steps are.

## 2020-11-17 NOTE — Telephone Encounter (Signed)
Patient scheduled for 10/26/20 at 9:10am.

## 2020-11-25 ENCOUNTER — Encounter: Payer: Self-pay | Admitting: Neurology

## 2020-11-25 ENCOUNTER — Ambulatory Visit: Payer: Medicare PPO | Admitting: Neurology

## 2020-11-25 ENCOUNTER — Other Ambulatory Visit: Payer: Self-pay

## 2020-11-25 VITALS — BP 130/84 | HR 88 | Resp 18 | Ht 64.0 in | Wt 197.0 lb

## 2020-11-25 DIAGNOSIS — R29898 Other symptoms and signs involving the musculoskeletal system: Secondary | ICD-10-CM | POA: Diagnosis not present

## 2020-11-25 DIAGNOSIS — G629 Polyneuropathy, unspecified: Secondary | ICD-10-CM

## 2020-11-25 DIAGNOSIS — G610 Guillain-Barre syndrome: Secondary | ICD-10-CM

## 2020-11-25 MED ORDER — GABAPENTIN 300 MG PO CAPS: ORAL_CAPSULE | ORAL | 1 refills | Status: DC

## 2020-11-25 NOTE — Progress Notes (Signed)
Follow-up Visit   Date: 11/25/20   Hailey Bates MRN: [id number] DOB: Jun 14, 1955   Interim History: Hailey Bates is a 65 y.o. right-handed Caucasian female with hypertension and insomnia returning to the clinic with new complaints of right hand weakness.  She was previously seen in April 2022 for left arm weakness.  The patient was accompanied to the clinic by cousin Arville Go) who also provides collateral information.    History of present illness: In September 2021, she woke up in the middle of the night with left proximal arm throbbing pain.  She was given steroid injection for possible muscle strain. Because of unrelenting pain, she was given 5-days of prednisone which helped her pain when taking the steroids, but then returned once she stopped it.  She was seen by orthopeadics who felt she may have high nerve entrapment. She completed physical therapy which did not help her symptoms.  By November, she was having tingling and sharp pain over upper lateral arm, forearm, and dorsum of the thumb, index finger, and middle finger.  She has some improvement in pain with gabapentin 300mg  three times daily, which significantly helps the pain.  She has weakness in the left hand and arm. MRI cervical spine shows cervical spondylosis with neural foraminal stenosis at C5-6 bilaterally, which would not explain symptoms, so she was referred for EMG.  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  She is here to discuss these results.   Of note, since February, she feels that her pain and weakness has stabilized and there has been no further progression.   Her brother and mother had burning pain in the feet, neither are diabetic.  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 She was previously working at a [employer]. She lives alone in a one-level home.  Her daughter lives in [place], [place].  No history of diabetes or heavy alcohol use.   UPDATE 11/25/2020: At her last  visit in April, I recommended CSF testing, however, patient tells me that her left arm weakness started to improve, so she decided not to proceed with testing.  She completed OT and had complete return of left arm strength.  She was doing well until 8/26, when she slipped off her bed and hit her right arm against the nightstand. About two days later, she began having excruciating pain, described as achy/throbbing involving the upper arm and forearm.  No associated tingling or numbness or weakness at onset.  About three weeks later, she began having weakness in the right hand, where she was unable to open or make a fist on the right hand.  She has tried ibuprofen and tylenol which helps her pain.  She continues to have ongoing right hand weakness and entire arm pain.  She is unable to use the hand.  She was referred for EMG by Dr. Vanetta Shawl office which shows asymmetric sensorimotor polyneuropathy, with axonal and demyelinating features, affecting the upper extremities (worse on the right), which has progressed compared to study on 04/29/2020.  She is here for further evaluation.  Medications:  Current Outpatient Medications on File Prior to Visit  Medication Sig Dispense Refill   amLODipine-olmesartan (AZOR) 5-20 MG tablet Take 1 tablet by mouth daily.     cyclobenzaprine (FLEXERIL) 10 MG tablet Take 10 mg by mouth daily as needed. Takes for muscle spasms     gabapentin (NEURONTIN) 300 MG capsule Take 1 capsule (300 mg total) by mouth 3 (three) times daily. (Patient  taking differently: Take 300 mg by mouth 4 (four) times daily.) 270 capsule 3   HYDROcodone-acetaminophen (NORCO) 5-325 MG per tablet Take 1 tablet by mouth every 6 (six) hours as needed. Takes for pain     zolpidem (AMBIEN) 10 MG tablet Take 10 mg by mouth at bedtime.     amitriptyline (ELAVIL) 25 MG tablet Take 25 mg by mouth at bedtime.   (Patient not taking: Reported on 05/11/2020)     naproxen (NAPROSYN) 500 MG tablet Take 500 mg by mouth  daily.   (Patient not taking: Reported on 05/11/2020)     No current facility-administered medications on file prior to visit.    Allergies: No Known Allergies  Vital Signs:  BP 130/84   Pulse 88   Resp 18   Ht 5\' 4"  (1.626 m)   Wt 197 lb (89.4 kg)   SpO2 98%   BMI 33.81 kg/m   Neurological Exam: MENTAL STATUS including orientation to time, place, person, recent and remote memory, attention span and concentration, language, and fund of knowledge is normal.  Speech is not dysarthric.  CRANIAL NERVES:  No visual field defects.  Pupils equal round and reactive to light.  Normal conjugate, extra-ocular eye movements in all directions of gaze.  No ptosis.  Face is symmetric. Palate elevates symmetrically.  Tongue is midline.  MOTOR:  No atrophy, fasciculations or abnormal movements.  No pronator drift.   Upper Extremity:  Right  Left  Deltoid  5/5   5/5   Biceps  5/5   5/5   Triceps  3/5   5/5   Wrist extensors  1/5   5/5   Wrist flexors  3/5   5/5   Finger extensors  2/5   5/5   Finger flexors  3/5   5/5   Dorsal interossei  2/5   5/5   Abductor pollicis  2/5   5/5   Tone (Ashworth scale)  0  0   Lower Extremity:  Right  Left  Hip flexors  5/5   5/5   Hip extensors  5/5   5/5   Adductor 5/5  5/5  Abductor 5/5  5/5  Knee flexors  5/5   5/5   Knee extensors  5/5   5/5   Dorsiflexors  5/5   5/5   Plantarflexors  5/5   5/5   Toe extensors  5/5   5/5   Toe flexors  5/5   5/5   Tone (Ashworth scale)  0  0    MSRs:                                           Right        Left brachioradialis 2+  2+  biceps 2+  2+  triceps 2+  2+  patellar 2+  2+  ankle jerk 2+  2+  plantar response down  down   SENSORY: Temperature and pin prick reduced over the dorsum of the right hand, sensation intact in the forearm and palm. Left hand and bilateral leg sensation is normal.   COORDINATION/GAIT:  Normal finger-to- nose-finger.  Finger tapping is slowed on the right hand due to  weakness.  Normal finger tapping on the left.  Gait narrow based and stable.  Stressed and tandem gait intact.  Data: NCS/EMG of the upper extremities 11/12/2020: This is a complex  study.  The electrophysiologic findings are most suggestive of an asymmetric sensorimotor polyneuropathy, with axonal and demyelinating features, affecting the upper extremities (worse on the right), which has progressed compared to study on 04/29/2020.  Recommend further evaluation for polyradiculoneuropathy.  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 Her exam shoes multiple nerve involvement including the radial, ulnar, and median nerves.  NCS/EMG is most suggestive of an asymmetric sensorimotor polyneuropathy; however, with her fluctuating course, I am more concerned about polyradiculoneuropathy.  Her reflexes are intact throughout, which I would expect to be absent with chronic inflammatory polyradiculoneuropathy.  Serology testing in the past was unremarkable.  I will plan to proceed with spinal fluid analysis to evaluate these symptoms.  PLAN/RECOMMENDATIONS:  MRI brain wo contrast prior to LP Check CSF cell count and diff, protein, glucose, IgG index, oligoclonal bands, flow cytology, ACE Increase gabapentin to 600mg  TID - ok to take an extra 300mg /d as needed Start out-patient occupational therapy  Further recommendations pending results.  Total time spent reviewing records, interview, history/exam, documentation, and coordination of care on day of encounter:  45 min   Thank you for allowing me to participate in patient's care.  If I can answer any additional questions, I would be pleased to do so.    Sincerely,    Blayke Cordrey K. Posey Pronto, DO

## 2020-11-25 NOTE — Patient Instructions (Addendum)
[facility] occupational therapy in [place]  MRI brain without contrast  Lumbar puncture  Increase gabapentin 600mg  three times daily

## 2020-12-05 ENCOUNTER — Ambulatory Visit
Admission: RE | Admit: 2020-12-05 | Discharge: 2020-12-05 | Disposition: A | Discharge status: Home / Self Care | Payer: Medicare PPO | Source: Ambulatory Visit | Attending: Neurology | Admitting: Neurology

## 2020-12-05 IMAGING — MR MR HEAD W/O CM
11 series · 48 of 48 positions shown · non-contrast
Comparison: Cervical spine MRI [DATE].

CLINICAL DATA: 65-year-old female status post fall in [REDACTED].
Tremor, shaking. Left arm weakness and right hand pain.

EXAM:
MRI HEAD WITHOUT CONTRAST
TECHNIQUE: Multiplanar, multiecho pulse sequences of the brain and surrounding
structures were obtained without intravenous contrast.

[Series 5: T1 · sagittal · 4.0mm · 0.75mm/px · 2 of 33 slices shown (1 of 2)]
[im 1/33]
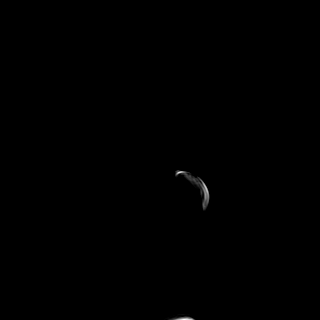
[im 33/33]
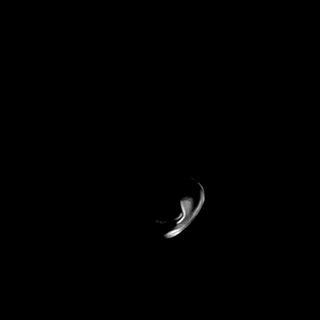

[Series 6: DWI · axial · 3.0mm · 0.94mm/px · z∈[-62,+95]mm · 11 of 179 slices shown (1 of 3)]
[im 1/179]
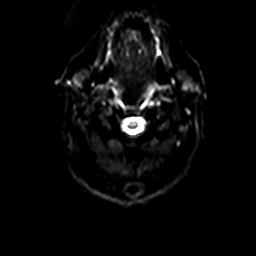
[im 18/179]
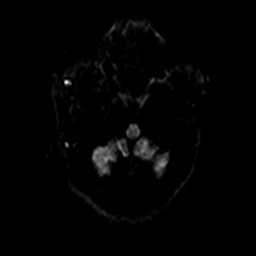
[im 36/179]
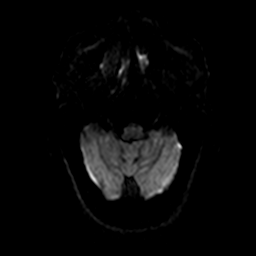
[im 54/179]
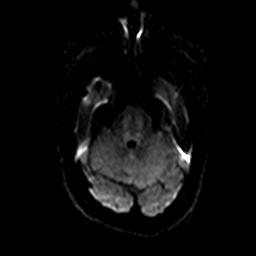
[im 72/179]
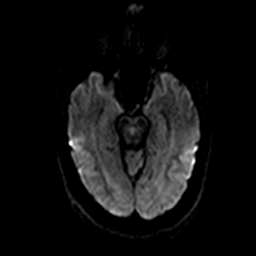
[im 90/179]
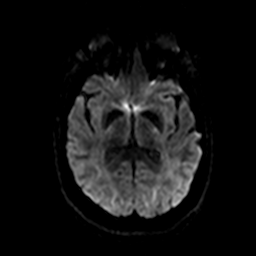
[im 107/179]
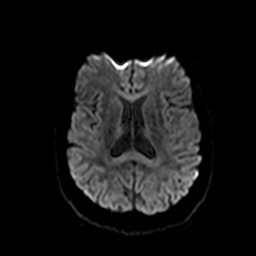
[im 125/179]
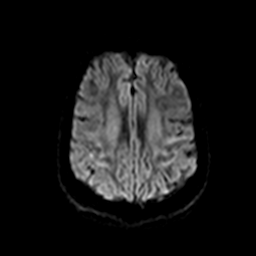
[im 143/179]
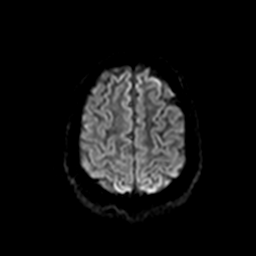
[im 161/179]
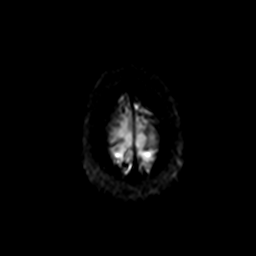
[im 179/179]
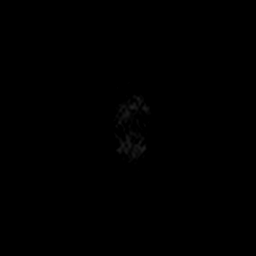

[Series 7: ax dwi_tracew · axial · 3.0mm · 0.94mm/px · z∈[-62,+95]mm · 5 of 90 slices shown]
[im 1/90]
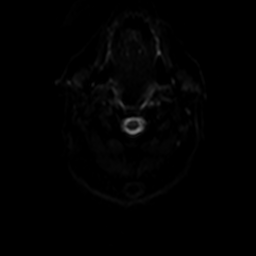
[im 23/90]
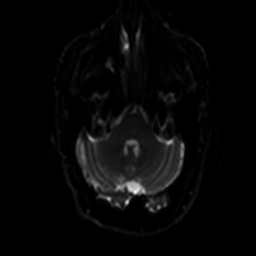
[im 45/90]
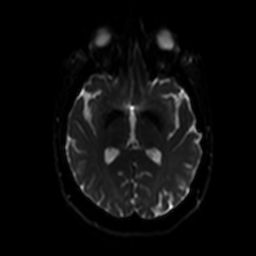
[im 67/90]
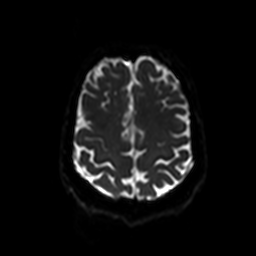
[im 90/90]
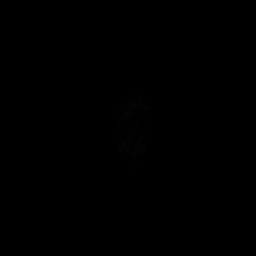

[Series 8: ax dwi_adc · axial · 3.0mm · 0.94mm/px · z∈[-62,+95]mm · 3 of 45 slices shown]
[im 1/45]
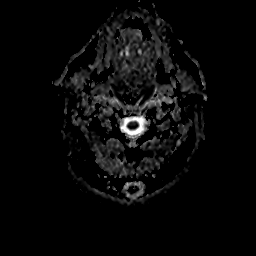
[im 23/45]
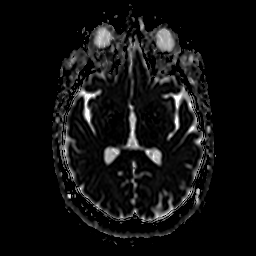
[im 45/45]
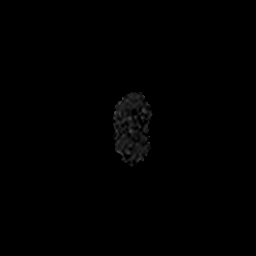

[Series 9: DWI · coronal · 5.0mm · 1.50mm/px · 4 of 75 slices shown (2 of 3)]
[im 1/75]
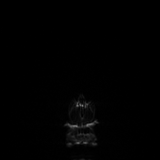
[im 25/75]
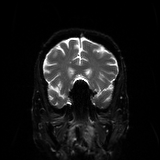
[im 50/75]
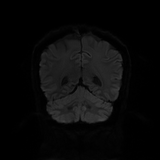
[im 75/75]
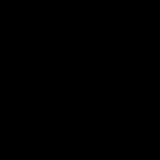

[Series 10: DWI · coronal · 5.0mm · 1.50mm/px · 2 of 37 slices shown (3 of 3)]
[im 1/37]
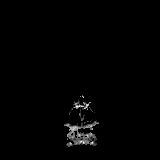
[im 37/37]
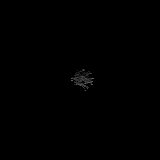

[Series 11: T2 · axial · 4.0mm · 0.36mm/px · z∈[-61,+115]mm · 2 of 35 slices shown (1 of 2)]
[im 1/35]
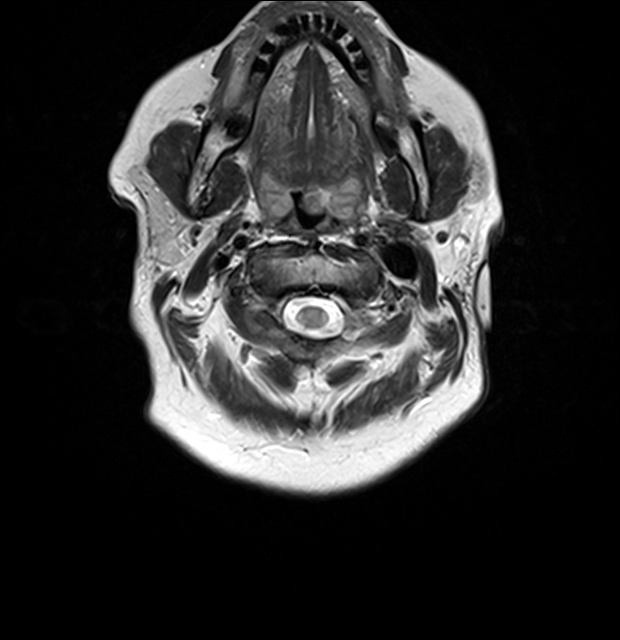
[im 35/35]
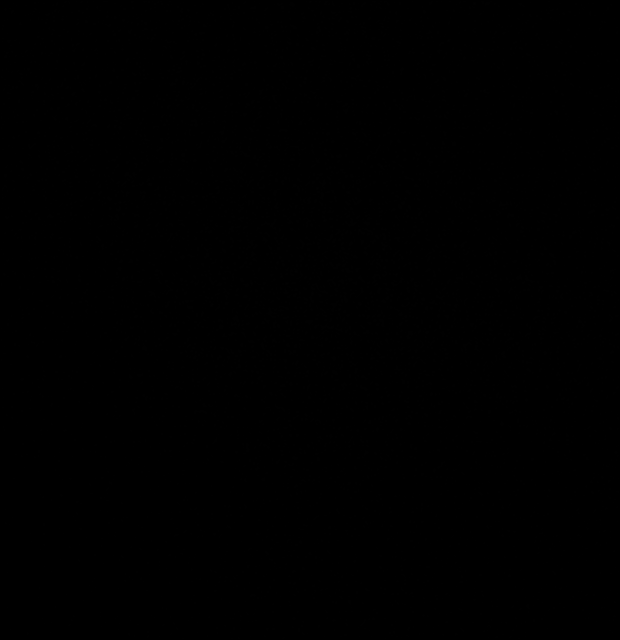

[Series 12: FLAIR · axial · 3.0mm · 0.72mm/px · z∈[-48,+102]mm · 2 of 26 slices shown]
[im 1/26]
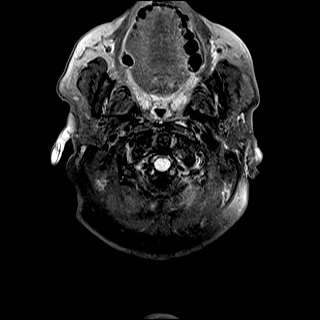
[im 26/26]
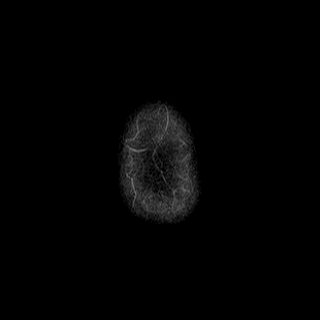

[Series 13: swi_images · axial · 1.5mm · 0.90mm/px · z∈[-45,+97]mm · 6 of 96 slices shown]
[im 1/96]
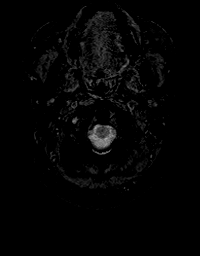
[im 20/96]
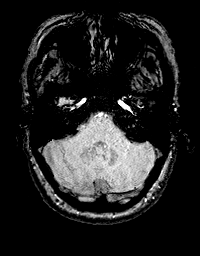
[im 39/96]
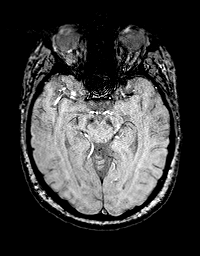
[im 58/96]
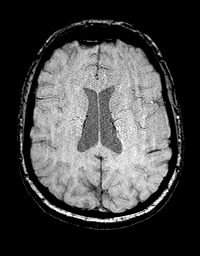
[im 77/96]
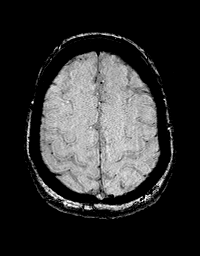
[im 96/96]
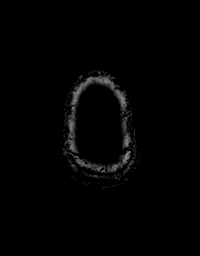

[Series 15: T1 · axial · 1.0mm · 0.94mm/px · z∈[-61,+97]mm · 9 of 160 slices shown (2 of 2)]
[im 1/160]
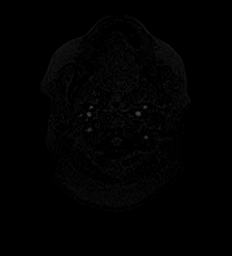
[im 20/160]
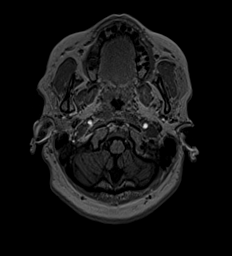
[im 40/160]
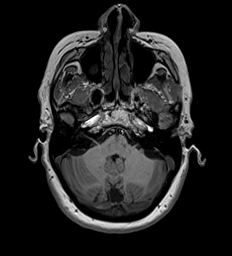
[im 60/160]
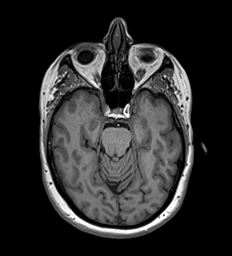
[im 80/160]
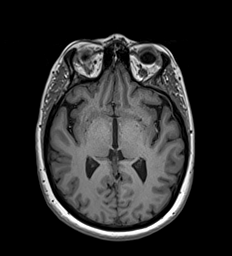
[im 100/160]
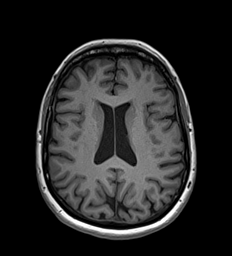
[im 120/160]
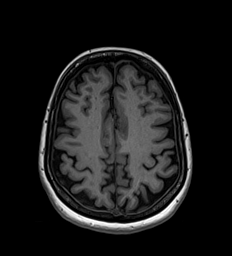
[im 140/160]
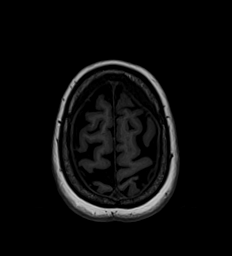
[im 160/160]
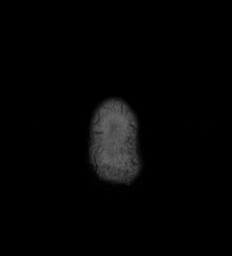

[Series 16: T2 · coronal · 4.5mm · 0.36mm/px · 2 of 35 slices shown (2 of 2)]
[im 1/35]
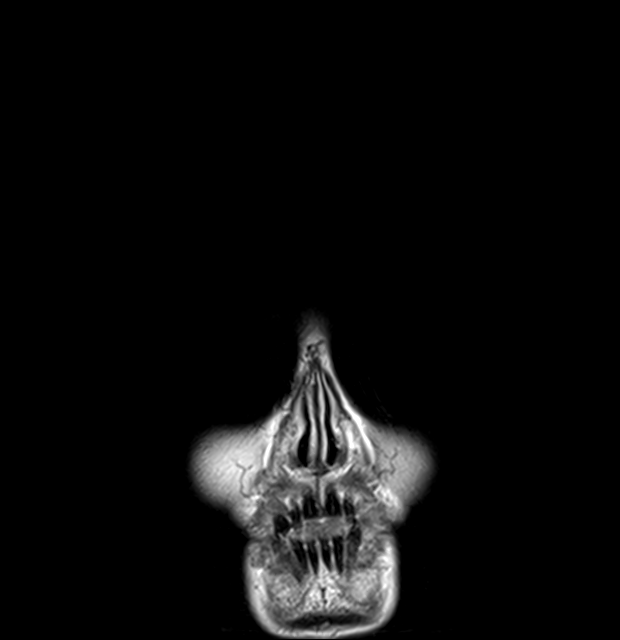
[im 35/35]
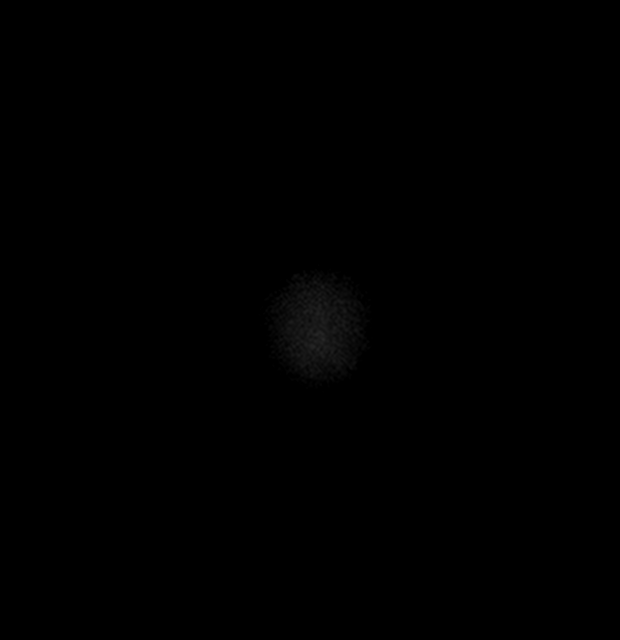

[48 of 48 positions shown; findings below may reference images not displayed]

FINDINGS: Brain: Cerebral volume is within normal limits for age.

No restricted diffusion to suggest acute infarction. No midline
shift, mass effect, evidence of mass lesion, ventriculomegaly,
extra-axial collection or acute intracranial hemorrhage.
Cervicomedullary junction and pituitary are within normal limits.

Gray and white matter signal is normal for age throughout the brain.
No encephalomalacia or chronic cerebral blood products identified.
Deep gray matter nuclei, brainstem and cerebellum appear normal.

Vascular: Major intracranial vascular flow voids are preserved.

Skull and upper cervical spine: Stable visible cervical spine.
Visualized bone marrow signal is within normal limits.

Sinuses/Orbits: Negative orbits. Mild maxillary sinus mucosal
thickening and/or retention cyst. But other paranasal sinuses are
clear. No sinus fluid level.

Other: Mastoids are clear. Visible internal auditory structures
appear normal. Negative visible scalp and face.
IMPRESSION: Normal for age noncontrast MRI appearance of the brain.

## 2020-12-08 ENCOUNTER — Other Ambulatory Visit: Payer: Self-pay

## 2020-12-08 ENCOUNTER — Ambulatory Visit (HOSPITAL_COMMUNITY): Payer: Medicare PPO | Attending: Neurology

## 2020-12-08 ENCOUNTER — Encounter (HOSPITAL_COMMUNITY): Payer: Self-pay

## 2020-12-08 DIAGNOSIS — M25621 Stiffness of right elbow, not elsewhere classified: Secondary | ICD-10-CM | POA: Diagnosis present

## 2020-12-08 DIAGNOSIS — M25631 Stiffness of right wrist, not elsewhere classified: Secondary | ICD-10-CM | POA: Diagnosis present

## 2020-12-08 DIAGNOSIS — G610 Guillain-Barre syndrome: Secondary | ICD-10-CM | POA: Insufficient documentation

## 2020-12-08 DIAGNOSIS — G629 Polyneuropathy, unspecified: Secondary | ICD-10-CM | POA: Insufficient documentation

## 2020-12-08 DIAGNOSIS — R29898 Other symptoms and signs involving the musculoskeletal system: Secondary | ICD-10-CM

## 2020-12-08 DIAGNOSIS — R278 Other lack of coordination: Secondary | ICD-10-CM | POA: Diagnosis present

## 2020-12-08 NOTE — Patient Instructions (Signed)
General Finger Flexion/Fist  Close the hand into a fist as far as possible.  Use the thumb of the opposite hand to push the fingers into a tighter grip.  Let pain be your guide stretching as aggressively as you can.  Hold for 5 seconds. Complete 10 times.    Wrist Extension Stretch   With arm of affected side extended in front of body with palm facing down, lift affected hand at wrist as far as possible using non-affected hand. At the end of the motion, the wrist should be extended so that you palm is facing outward as if you were telling someone to stop. Hold this stretch for 5-10 seconds. Complete 5 times.

## 2020-12-08 NOTE — Therapy (Signed)
[place], [place], [address] Phone: [phone]   Fax:  [phone]  Occupational Therapy Evaluation  Patient Details  Name: Hailey Bates MRN: [id number] Date of Birth: [birth year] Referring Provider (OT): Narda Amber, DO   Encounter Date: 12/08/2020   OT End of Session - 12/08/20 1630     Visit Number 1    Number of Visits 8    Date for OT Re-Evaluation 01/05/21    Authorization Type [employer]    Authorization Time Period no visit limit. $20 copay. Authorization form complete.    Progress Note Due on Visit 10    OT Start Time 1515    OT Stop Time 1603    OT Time Calculation (min) 48 min    Activity Tolerance Patient tolerated treatment well    Behavior During Therapy WFL for tasks assessed/performed             Past Medical History:  Diagnosis Date   Hypertension    Neuromuscular disorder ([place])    sciatica    Past Surgical History:  Procedure Laterality Date   ANTERIOR AND POSTERIOR REPAIR  01/04/2011   Procedure: ANTERIOR (CYSTOCELE) AND POSTERIOR REPAIR (RECTOCELE);  Surgeon: Reece Packer, MD;  Location: [facility];  Service: Urology;  Laterality: N/A;  anterior repair/cysto, with graft,no posterior repair   BLADDER SUSPENSION  01/04/2011   Procedure: High Rolls;  Surgeon: Reece Packer, MD;  Location: [facility];  Service: Urology;  Laterality: N/A;   CARDIAC CATHETERIZATION     CYSTOSCOPY  01/04/2011   Procedure: CYSTOSCOPY;  Surgeon: Reece Packer, MD;  Location: [facility];  Service: Urology;  Laterality: N/A;   NASAL SINUS SURGERY     VAGINAL HYSTERECTOMY  01/04/2011   Procedure: HYSTERECTOMY VAGINAL;  Surgeon: Lubertha South Romine;  Location: [facility];  Service: Gynecology;  Laterality: N/A;   VAGINAL PROLAPSE REPAIR  01/04/2011   Procedure: VAGINAL VAULT SUSPENSION;  Surgeon: Reece Packer, MD;  Location: [facility];  Service: Urology;  Laterality: N/A;    There were no  vitals filed for this visit.   Subjective Assessment - 12/08/20 1520     Subjective  S: My left arm had something similar happen to it last year and it ended up getting better.    Pertinent History Patient is a 65 y/o female presenting to OT with possible neuropathy/polyradiculoneuropathy with right hand/RUE weakness. Pt reports that she had a controlled fall from her bed while seated on the edge and she hit her right arm on the nightstand in August 2022. In September is when she began to experience decreased strength and functional use of her right hand. A MRI was completed on 12/05/20 per medical chart and the results were normal. She is scheduled for a lumbar puncture on 12/15/20. She underwent a nerve conduction study October 2022. Dr. Posey Pronto has referred patient to occupational therapy for evaluation and treatment.    Patient Stated Goals To figure out what is going on with her arm and return to using it as her dominant extremity.    Currently in Pain? Other (Comment)   no pain during the day. At night it's 5 to 8/10. From hand to shoulder              [facility] OT Assessment - 12/08/20 1523       Assessment   Medical Diagnosis Neuropathy, Polyradiculoneuropathy    Referring Provider (OT) Narda Amber, DO  Onset Date/Surgical Date --   September 2022   Hand Dominance Right    Next MD Visit --   TBD   Prior Therapy Received Outpatient PT in [place] for left ulnar nerve entrapment 12/11/19-01/03/20      Precautions   Precautions None      Restrictions   Weight Bearing Restrictions No      Balance Screen   Has the patient fallen in the past 6 months Yes    How many times? 1   controlled fall from seated on edge of bed. Hit arm on night stand in August 2022.   Has the patient had a decrease in activity level because of a fear of falling?  No    Is the patient reluctant to leave their home because of a fear of falling?  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 Able to assist with pulling up her pants. Had to switch to elastic pants. Unable to use her right UE as her dominant.      Mobility   Mobility Status Independent      Written Expression   Dominant Hand Right      Vision - History   Baseline Vision Wears glasses all the time      Cognition   Overall Cognitive Status Within Functional Limits for tasks assessed      Sensation   Additional Comments Pt reports numbness and tingling in her hand (palm and back side).      Coordination   Gross Motor Movements are Fluid and Coordinated No    Fine Motor Movements are Fluid and Coordinated No    9 Hole Peg Test --   unable to test   Box and Blocks --   unable to test     Edema   Edema right MCP joint: 18 cm left: 17.5 cm      Tone   Assessment Location Right Upper Extremity      ROM / Strength   AROM / PROM / Strength AROM;PROM      AROM   Overall AROM Comments Assessed with forearm resting on table in neutral postion/gravity eliminated plane    AROM Assessment Site Wrist;Elbow;Shoulder;Finger;Thumb    Right/Left Shoulder Right    Right Shoulder Flexion 130 Degrees    Right Shoulder ABduction 102 Degrees    Right Shoulder Internal Rotation 90 Degrees    Right Shoulder External Rotation 90 Degrees    Right/Left Elbow Right    Right Elbow Flexion --   WNL   Right Elbow Extension -15    Right/Left Wrist Right    Right Wrist Flexion 40 Degrees    Right/Left Finger Right    Right Composite Finger Flexion 25%   Able to flex 4th and 5th digit into fist. No movement noted with 1st-3rd digits.   Right/Left Thumb Right    Right Thumb Opposition --   no active thumb movement demonstrated     PROM   Overall PROM Comments Able  to passively flex hand/all digits into a gross fist. Able to passively extend all digits fully.    PROM  Assessment Site Wrist;Elbow    Right/Left Elbow Right    Right Elbow Extension -8    Right/Left Wrist Right    Right Wrist Extension 30 Degrees    Right Wrist Flexion 80 Degrees      RUE Tone   RUE Tone Mild;Hypertonic;Hypotonic      RUE Tone   Hypertonic Details At rest, patient's hand is in a C/claw shape.    Hypotonic Details wrist drop present                              OT Education - 12/08/20 1610     Education Details Provided piece of [employer] to help with opening jars and containers. HEP: Passive finger flexion and extension, passive wrist extension. Demonstrated use of a rocker knife to cut food using her left hand.    Person(s) Educated Patient    Methods Explanation;Demonstration;Verbal cues;Handout    Comprehension Returned demonstration;Verbalized understanding              OT Short Term Goals - 12/08/20 1814       OT SHORT TERM GOAL #1   Title patient will be educated and independent with HEP in order to faciliate her progress in therapy and work on increasing functional mobility of her RUE.    Time 4    Period Weeks    Status New    Target Date 01/05/21      OT SHORT TERM GOAL #2   Title Patient will be provided with appropriate orthotics to help with tone management/reduction and increase functional hand use during daily tasks.    Time 4    Period Weeks    Status New      OT SHORT TERM GOAL #3   Title Patient will increase her RUE P/ROM to [facility] for the shoulder, elbow, wrist and hand while participating in a HEP consistantly at home.    Time 4    Period Weeks    Status New                      Plan - 12/08/20 1632     Clinical Impression Statement A: Pt is a 65 y/o female presenting to OT with a new onset of right hand and UE weakness and decreased ROM and coordination with cause unknown at this time. Pt is unable  to utilize her right dominant extremity to complete any functional tasks. As the cause of her deficits is currently unknown, her MD is currently completing recommended tests and procedures to rule out and/or confirm a diagnosis. At this time, the area of greatest concern is the increased tone that has started in the right wrist and hand. Proper splinting is necessary to decrease the increased tone and attempt to slow down any more development.    OT Occupational Profile and History Detailed Assessment- Review of Records and additional review of physical, cognitive, psychosocial history related to current functional performance    Occupational performance deficits (Please refer to evaluation for details): ADL's;IADL's;Leisure;Rest and Sleep;Work    Marketing executive / Function / Physical Skills ADL;UE functional use;Fascial restriction;Pain;FMC;ROM;Coordination;Dexterity;Mobility;Edema;Tone;Strength;IADL;Sensation    Rehab Potential Good    Clinical Decision Making Multiple treatment options, significant modification of task necessary    Comorbidities Affecting Occupational Performance: May have comorbidities impacting occupational performance    Modification or Assistance to Complete Evaluation  Min-Moderate modification of tasks or assist with assess necessary to complete eval  OT Frequency Other (comment)   1-2 times a week   OT Duration 4 weeks    OT Treatment/Interventions Self-care/ADL training;Ultrasound;Patient/family education;DME and/or AE instruction;Paraffin;Passive range of motion;Cryotherapy;Electrical Stimulation;Moist Heat;Neuromuscular education;Therapeutic exercise;Manual Therapy;Therapeutic activities;Splinting    Plan P: Patient will benefit from skilled OT services to decrease further tone development with splint fabrication and to increase passive and active ROM of her RUE. Treatment plan: Order nerve palsy splint. Fabricate resting hand splint with fingers extended. Passive ROM of the  shoulder, elbow, wrist, and hand. Work towards A/ROM of the [employer]. Schedule more appointments at next session.    OT Home Exercise Plan eval: passive wrist extension and finger flexion and extension    Consulted and Agree with Plan of Care Patient             Patient will benefit from skilled therapeutic intervention in order to improve the following deficits and impairments:   Body Structure / Function / Physical Skills: ADL, UE functional use, Fascial restriction, Pain, FMC, ROM, Coordination, Dexterity, Mobility, Edema, Tone, Strength, IADL, Sensation       Visit Diagnosis: Other lack of coordination - Plan: Ot plan of care cert/re-cert  Stiffness of right elbow, not elsewhere classified - Plan: Ot plan of care cert/re-cert  Stiffness of right wrist, not elsewhere classified - Plan: Ot plan of care cert/re-cert  Other symptoms and signs involving the musculoskeletal system - Plan: Ot plan of care cert/re-cert    Problem List There are no problems to display for this patient.  Ailene Ravel, OTR/L,CBIS  [phone]  12/08/2020, 6:23 PM  [facility] [address] [place], [place], [address] Phone: [phone]   Fax:  [phone]  Name: LAMARIA HILDEBRANDT MRN: [id number] Date of Birth: [birth year]

## 2020-12-15 ENCOUNTER — Ambulatory Visit
Admission: RE | Admit: 2020-12-15 | Discharge: 2020-12-15 | Disposition: A | Discharge status: Home / Self Care | Payer: Medicare PPO | Source: Ambulatory Visit | Attending: Neurology | Admitting: Neurology

## 2020-12-15 ENCOUNTER — Other Ambulatory Visit: Payer: Self-pay

## 2020-12-15 VITALS — BP 126/90 | HR 67

## 2020-12-15 DIAGNOSIS — G610 Guillain-Barre syndrome: Secondary | ICD-10-CM

## 2020-12-15 DIAGNOSIS — R278 Other lack of coordination: Secondary | ICD-10-CM | POA: Diagnosis not present

## 2020-12-15 IMAGING — XA DG SPINAL PUNCT LUMBAR DIAG WITH FL CT GUIDANCE
1 series · 1 of 1 positions shown · non-contrast
Comparison: MRI brain [DATE]

CLINICAL DATA: Polyneuropathy

EXAM:
DIAGNOSTIC LUMBAR PUNCTURE UNDER FLUOROSCOPIC GUIDANCE

[Series 1: ortho standard · 1 of 1 slices shown]
[im 1/1]
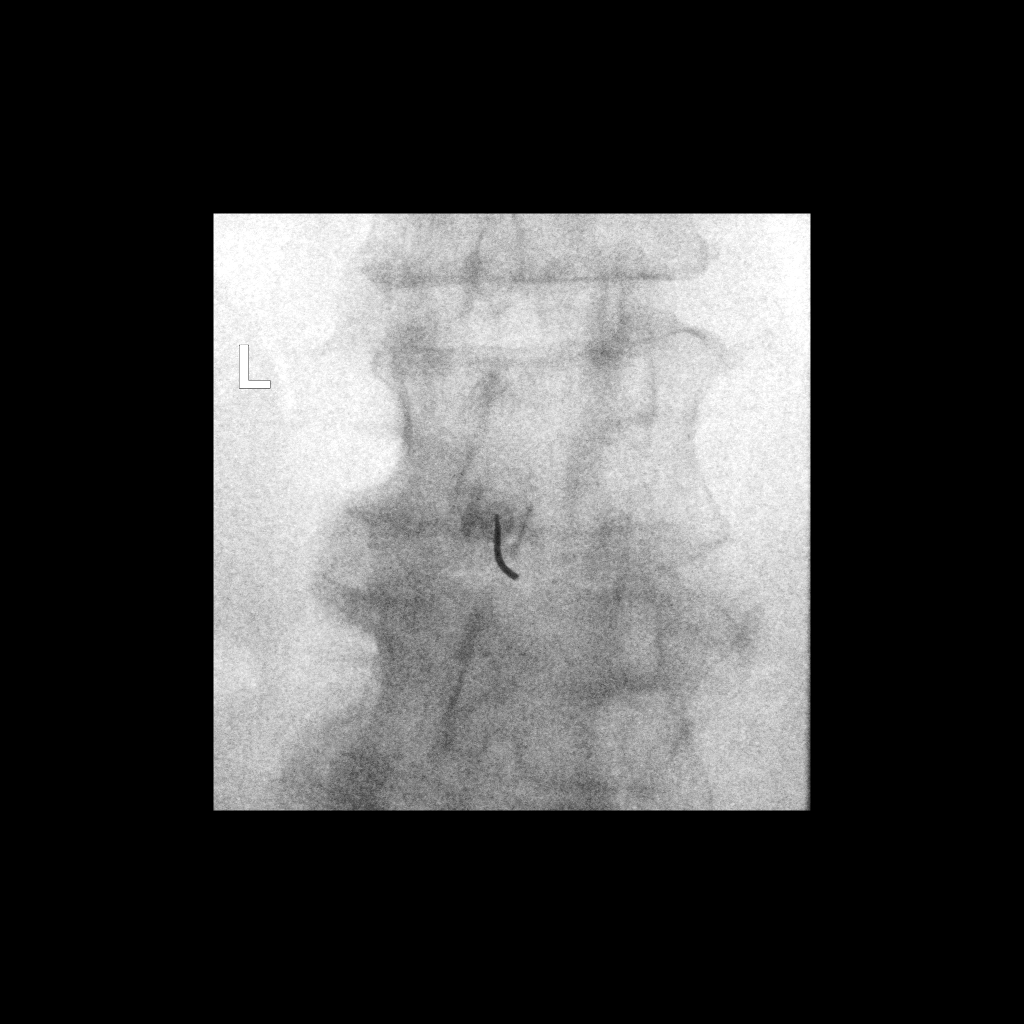

[1 of 1 positions shown; findings below may reference images not displayed]

FLUOROSCOPY TIME:  Fluoroscopy Time:  0 minutes 29 seconds

Radiation Exposure Index (if provided by the fluoroscopic device):
4.6 mGy

Number of Acquired Spot Images: 0

PROCEDURE:
Informed consent was obtained from the patient prior to the
procedure, including potential complications of headache, allergy,
and pain. With the patient prone, the lower back was prepped with
Betadine. 1% Lidocaine was used for local anesthesia. Lumbar
puncture was performed at the L2-L3 level using a 20 gauge needle
with return of clear CSF. Opening pressure was normal. 11 mL of CSF
were obtained for laboratory studies. The patient tolerated the
procedure well and there were no apparent complications.
IMPRESSION: 1. Successful L2-L3 lumbar puncture.
2. Normal opening pressure.

## 2020-12-15 NOTE — Progress Notes (Signed)
Labs drawn from pts LAC by A. [place], RN to be sent off with LP lab work. 1 successful attempt, pt tolerated well. Gauze and tape applied after.

## 2020-12-15 NOTE — Discharge Instructions (Signed)

## 2020-12-16 LAB — CYTOLOGY - NON PAP

## 2020-12-17 ENCOUNTER — Encounter (HOSPITAL_COMMUNITY): Payer: Self-pay

## 2020-12-17 ENCOUNTER — Other Ambulatory Visit: Payer: Self-pay

## 2020-12-17 ENCOUNTER — Ambulatory Visit (HOSPITAL_COMMUNITY): Payer: Medicare PPO

## 2020-12-17 DIAGNOSIS — R278 Other lack of coordination: Secondary | ICD-10-CM

## 2020-12-17 DIAGNOSIS — M25631 Stiffness of right wrist, not elsewhere classified: Secondary | ICD-10-CM

## 2020-12-17 DIAGNOSIS — M25621 Stiffness of right elbow, not elsewhere classified: Secondary | ICD-10-CM

## 2020-12-17 DIAGNOSIS — R29898 Other symptoms and signs involving the musculoskeletal system: Secondary | ICD-10-CM

## 2020-12-17 NOTE — Patient Instructions (Signed)
Your Splint This splint should initially be fitted by a healthcare practitioner.  The healthcare practitioner is responsible for providing wearing instructions and precautions to the patient, other healthcare practitioners and care provider involved in the patient's care.  This splint was custom made for you. Please read the following instructions to learn about wearing and caring for your splint.  Precautions Should your splint cause any of the following problems, remove the splint immediately and contact your therapist/physician. Swelling Severe Pain Pressure Areas Stiffness Numbness  Do not wear your splint while operating machinery unless it has been fabricated for that purpose.  When To Wear Your Splint Where your splint according to your therapist/physician instructions. Nights and rest periods only  Care and Cleaning of Your Splint Keep your splint away from open flames. Your splint will lose its shape in temperatures over 135 degrees Farenheit, ( in car windows, near radiators, ovens or in hot water).  Never make any adjustments to your splint, if the splint needs adjusting remove it and make an appointment to see your therapist. Your splint, including the cushion liner may be cleaned with soap and lukewarm water.  Do not immerse in hot water over 135 degrees Farenheit. Straps may be washed with soap and water, but do not moisten the self-adhesive portion. For ink or hard to remove spots use a scouring cleanser which contains chlorine.  Rinse the splint thoroughly after using chlorine cleanser.

## 2020-12-17 NOTE — Therapy (Signed)
[facility] [place], [place], [address] Phone: [phone]   Fax:  [phone]  Occupational Therapy Treatment  Patient Details  Name: Hailey Bates MRN: [id number] Date of Birth: 23-May-1955 Referring Provider (OT): Narda Amber, DO   Encounter Date: 12/17/2020   OT End of Session - 12/17/20 1524     Visit Number 2    Number of Visits 8    Date for OT Re-Evaluation 01/05/21    Authorization Type [employer]    Authorization Time Period no visit limit. $20 copay. Authorization form complete.    Progress Note Due on Visit 10    OT Start Time 1310    OT Stop Time 1425    OT Time Calculation (min) 75 min    Activity Tolerance Patient tolerated treatment well    Behavior During Therapy WFL for tasks assessed/performed             Past Medical History:  Diagnosis Date   Hypertension    Neuromuscular disorder ([place])    sciatica    Past Surgical History:  Procedure Laterality Date   ANTERIOR AND POSTERIOR REPAIR  01/04/2011   Procedure: ANTERIOR (CYSTOCELE) AND POSTERIOR REPAIR (RECTOCELE);  Surgeon: Reece Packer, MD;  Location: [facility];  Service: Urology;  Laterality: N/A;  anterior repair/cysto, with graft,no posterior repair   BLADDER SUSPENSION  01/04/2011   Procedure: [place];  Surgeon: Reece Packer, MD;  Location: [facility];  Service: Urology;  Laterality: N/A;   CARDIAC CATHETERIZATION     CYSTOSCOPY  01/04/2011   Procedure: CYSTOSCOPY;  Surgeon: Reece Packer, MD;  Location: [facility];  Service: Urology;  Laterality: N/A;   NASAL SINUS SURGERY     VAGINAL HYSTERECTOMY  01/04/2011   Procedure: HYSTERECTOMY VAGINAL;  Surgeon: Lubertha South Romine;  Location: [facility];  Service: Gynecology;  Laterality: N/A;   VAGINAL PROLAPSE REPAIR  01/04/2011   Procedure: VAGINAL VAULT SUSPENSION;  Surgeon: Reece Packer, MD;  Location: [facility];  Service: Urology;  Laterality: N/A;    There were no  vitals filed for this visit.   Subjective Assessment - 12/17/20 1521     Subjective  S: It feels better already (after splint is donned).    Currently in Pain? Other (Comment)   Reports that her pain is primarily at night although it is hurting more during the day. No pain score provided.               [facility] OT Assessment - 12/17/20 1522       Assessment   Medical Diagnosis Neuropathy, Polyradiculoneuropathy      Precautions   Precautions None                      OT Treatments/Exercises (OP) - 12/17/20 1522       Splinting   Splinting Right hand dorsal based resting hand splint fabricated. Two 2 inch wide straps and two 1 inch wide straps used to secure, Four sockettes provided.                    OT Education - 12/17/20 1523     Education Details Splint education handout provided reviewing care management, wearing schedule, precautions. Reviewed donning/doffing technique.    Person(s) Educated Patient    Methods Demonstration;Handout;Verbal cues    Comprehension Returned demonstration;Verbalized understanding              OT Short  Term Goals - 12/17/20 1528       OT SHORT TERM GOAL #1   Title patient will be educated and independent with HEP in order to faciliate her progress in therapy and work on increasing functional mobility of her RUE.    Time 4    Period Weeks    Status On-going    Target Date 01/05/21      OT SHORT TERM GOAL #2   Title Patient will be provided with appropriate orthotics to help with tone management/reduction and increase functional hand use during daily tasks.    Time 4    Period Weeks    Status Achieved      OT SHORT TERM GOAL #3   Title Patient will increase her RUE P/ROM to [facility] for the shoulder, elbow, wrist and hand while participating in a HEP consistantly at home.    Time 4    Period Weeks    Status On-going                      Plan - 12/17/20 1524     Clinical Impression  Statement A: Splint fabricated in clinic for nighttime use. Right hand dorsal based resting hand brace with wrist slightly extended and all fingers in a resting extended position. Patient was able to verbalize and/or demonstrate understanding of all education including donning/doffing technique. Lumber puncture was performed this week. Patient reports that it can take 7 weeks for the results. Her blood work has come back normal.    Body Structure / Function / Physical Skills ADL;UE functional use;Fascial restriction;Pain;FMC;ROM;Coordination;Dexterity;Mobility;Edema;Tone;Strength;IADL;Sensation    Plan P: Waiting for patient to receive results of her lumber puncture to determine if completing therapy for her right hand weakness is warranted or if medical treatment is needed first. Patient will call clinic to follow up.    Consulted and Agree with Plan of Care Patient             Patient will benefit from skilled therapeutic intervention in order to improve the following deficits and impairments:   Body Structure / Function / Physical Skills: ADL, UE functional use, Fascial restriction, Pain, FMC, ROM, Coordination, Dexterity, Mobility, Edema, Tone, Strength, IADL, Sensation       Visit Diagnosis: Other lack of coordination  Stiffness of right elbow, not elsewhere classified  Stiffness of right wrist, not elsewhere classified  Other symptoms and signs involving the musculoskeletal system    Problem List There are no problems to display for this patient.   Ailene Ravel, OTR/L,CBIS  [phone]  12/17/2020, 3:28 PM  [facility] [address] [place], [place], [address] Phone: [phone]   Fax:  [phone]  Name: Hailey Bates MRN: [id number] Date of Birth: 1955/05/18

## 2020-12-22 LAB — ANGIOTENSIN CONVERTING ENZYME, CSF: ANGIOTENSIN CONVERTING ENZYME ( ACE) CSF: 8 U/L (ref <=15)

## 2020-12-22 LAB — CSF CELL COUNT WITH DIFFERENTIAL
RBC Count, CSF: 0 cells/uL
WBC, CSF: 1 cells/uL (ref 0–5)

## 2020-12-22 LAB — CNS IGG SYNTHESIS RATE, CSF+BLOOD
Albumin Serum: 4 g/dL (ref 3.2–4.6)
Albumin, CSF: 31.4 mg/dL (ref 8.0–42.0)
CNS-IgG Synthesis Rate: -1.9 mg/24 h (ref <=3.3)
IgG (Immunoglobin G), Serum: 921 mg/dL (ref 600–1540)
IgG Total CSF: 3.5 mg/dL (ref 0.8–7.7)
IgG-Index: 0.48 (ref <0.66)

## 2020-12-22 LAB — OLIGOCLONAL BANDS, CSF + SERM

## 2020-12-22 LAB — GLUCOSE, CSF: Glucose, CSF: 39 mg/dL — ABNORMAL LOW (ref 40–80)

## 2020-12-22 LAB — MYELIN BASIC PROTEIN, CSF: Myelin Basic Protein: <2 mcg/L (ref <=4.0)

## 2020-12-22 LAB — PROTEIN, CSF: Total Protein, CSF: 43 mg/dL (ref 15–60)

## 2020-12-28 ENCOUNTER — Telehealth: Payer: Self-pay | Admitting: Neurology

## 2020-12-28 NOTE — Telephone Encounter (Signed)
Please see if she can come for follow-up visit or VV on 11/30 at 9:50a so we can discuss her results.

## 2020-12-28 NOTE — Telephone Encounter (Signed)
Pt called in wanting to get her results from her lumbar puncture and find out if Dr. Posey Pronto has come up with a diagnosis for her

## 2020-12-30 ENCOUNTER — Ambulatory Visit: Payer: Medicare PPO | Admitting: Neurology

## 2020-12-30 ENCOUNTER — Other Ambulatory Visit (INDEPENDENT_AMBULATORY_CARE_PROVIDER_SITE_OTHER): Payer: Medicare PPO

## 2020-12-30 ENCOUNTER — Encounter: Payer: Self-pay | Admitting: Neurology

## 2020-12-30 ENCOUNTER — Other Ambulatory Visit: Payer: Self-pay

## 2020-12-30 ENCOUNTER — Telehealth: Payer: Self-pay

## 2020-12-30 VITALS — BP 134/84 | HR 86 | Ht 64.0 in | Wt 201.0 lb

## 2020-12-30 DIAGNOSIS — M25461 Effusion, right knee: Secondary | ICD-10-CM | POA: Diagnosis not present

## 2020-12-30 DIAGNOSIS — M25561 Pain in right knee: Secondary | ICD-10-CM

## 2020-12-30 DIAGNOSIS — G619 Inflammatory polyneuropathy, unspecified: Secondary | ICD-10-CM

## 2020-12-30 DIAGNOSIS — G610 Guillain-Barre syndrome: Secondary | ICD-10-CM

## 2020-12-30 LAB — C-REACTIVE PROTEIN: CRP: <1 mg/dL (ref 0.5–20.0)

## 2020-12-30 LAB — SEDIMENTATION RATE: Sed Rate: 57 mm/hr — ABNORMAL HIGH (ref 0–30)

## 2020-12-30 NOTE — Telephone Encounter (Signed)
Hailey Bates at [facility] [phone] and was informed that they do infusions Tuesday/Wednesday/Thursday. Hailey Bates has informed me that she will fax over an Infusion form and stated once I speak to Dr. Posey Pronto about the days we can go ahead and fill out form and fax that back to her.   Dr. Posey Pronto- You requested patient to have her infusions for 5 days. [facility] only does infusions on Tuesday/Wednesday/Thursday. Will it be ok for patient to have it done 3 days one week and 2 days the next? Or do we need to send her to another infusion center to have it done 5 days in a row?

## 2020-12-30 NOTE — Patient Instructions (Addendum)
Check labs  Refer to rheumatology for right knee pain and swelling  We we will Solumedrol 1g x 5 days - we will let you know where to go for this, preference will be at [facility].  You may take omeprazole 20mg  twice daily while on steroids, as this may cause acid reflex  Return to clinic in 6-8 weeks

## 2020-12-30 NOTE — Telephone Encounter (Signed)
Patient needing a infusion Solu Medrol 1 gram for a total of 5 days.   [facility] [phone]. Called and no answer. Need to ask them to send over form to be completed for  infusion.   Need to call patient to let her know once we get orders in so that she is aware that we are able to get her infusion at [facility] as requested.

## 2020-12-30 NOTE — Progress Notes (Signed)
Follow-up Visit   Date: 12/30/20   Hailey Bates MRN: [id number] DOB: 1955/05/25   Interim History: Hailey Bates is a 65 y.o. right-handed Caucasian female with hypertension and insomnia returning to the clinic with new complaints of right hand weakness.  She was previously seen in April 2022 for left arm weakness.  The patient was accompanied to the clinic by self.  History of present illness: In September 2021, she woke up in the middle of the night with left proximal arm throbbing pain.  She was given steroid injection for possible muscle strain. Because of unrelenting pain, she was given 5-days of prednisone which helped her pain when taking the steroids, but then returned once she stopped it.  She was seen by orthopeadics who felt she may have high nerve entrapment. She completed physical therapy which did not help her symptoms.  By November 2021, she was having tingling and sharp pain over upper lateral arm, forearm, and dorsum of the thumb, index finger, and middle finger.  She has some improvement in pain with gabapentin 3103m three times daily.  She has weakness in the left hand and arm. MRI cervical spine shows cervical spondylosis with neural foraminal stenosis at C5-6 bilaterally, which would not explain symptoms, so she was referred for EMG.  EDX from April 2022 showed sensorimotor polyneuropathy affecting both arms. Of note, since February, she feels that her pain and weakness has stabilized and there has been no further progression.   Her brother and mother had burning pain in the feet, neither are diabetic.  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  [place] in [place]  No history of diabetes or heavy alcohol use.   UPDATE 11/25/2020: She is accompanied by her cousin, JArville Go At her last visit in April, I recommended CSF testing, however, patient tells  me that her left arm weakness started to improve, so she decided not to proceed with testing.  She completed OT and had complete return of left arm strength.  She was doing well until 09/25/2020, when she slipped off her bed and hit her right arm against the nightstand. About two days later, she began having excruciating pain, described as achy/throbbing involving the upper arm and forearm.  No associated tingling or numbness or weakness at onset.  About three weeks later, she began having weakness in the right hand, where she was unable to open or make a fist on the right hand.  She has tried ibuprofen and tylenol which helps her pain.  She continues to have ongoing right hand weakness and entire arm pain.  She is unable to use the hand.  She was referred for EMG by Dr. [facility] which shows asymmetric sensorimotor polyneuropathy, with axonal and demyelinating features, affecting the upper extremities (worse on the right), which has progressed compared to study on 04/29/2020.  She is here for further evaluation.  UPDATE 12/30/2020:  She is here for follow-up visit to discuss the results of her CSF testing.  CSF protein and IgG index was normal, however there were 4 oligoclonal bands in the CSF which was not present in the serum.  Clinically, there has been no significant improvement or worsening of right hand weakness, she is still not able to use the hand at all.  Over the past 1-2 weeks, she has new right knee swelling and pain, especially if she  stands on it for prolonged period of time.  She continues to have mild swelling of the right hand and fingers.  Since increasing gabapentin to 692m TID, her pain has significantly improved. MRI brain was normal.  Medications:  Current Outpatient Medications on File Prior to Visit  Medication Sig Dispense Refill   amLODipine-olmesartan (AZOR) 5-20 MG tablet Take 1 tablet by mouth daily.     cyclobenzaprine (FLEXERIL) 10 MG tablet Take 10 mg by mouth daily  as needed. Takes for muscle spasms     gabapentin (NEURONTIN) 300 MG capsule Take 6061mthree times daily.  OK to take an extra as needed. 210 capsule 1   HYDROcodone-acetaminophen (NORCO) 5-325 MG per tablet Take 1 tablet by mouth every 6 (six) hours as needed. Takes for pain     zolpidem (AMBIEN) 10 MG tablet Take 10 mg by mouth at bedtime.     amitriptyline (ELAVIL) 25 MG tablet Take 25 mg by mouth at bedtime. (Patient not taking: Reported on 12/30/2020)     No current facility-administered medications on file prior to visit.    Allergies: No Known Allergies  Vital Signs:  BP 134/84   Pulse 86   Ht 5' 4"  (1.626 m)   Wt 201 lb (91.2 kg)   SpO2 99%   BMI 34.50 kg/m   Neurological Exam: MENTAL STATUS including orientation to time, place, person, recent and remote memory, attention span and concentration, language, and fund of knowledge is normal.  Speech is not dysarthric.  CRANIAL NERVES:   Normal conjugate, extra-ocular eye movements in all directions of gaze.  No ptosis.     MOTOR:  Moderate right hand and forearm atrophy. No fasciculations or abnormal movements.  No pronator drift. Right knee swelling is appreciated on her exam, as compared to the left knee, no erythema or warmth.  Upper Extremity:  Right  Left  Deltoid  5/5   5/5   Biceps  5/5   5/5   Triceps  3/5   5/5   Wrist extensors  1/5   5/5   Wrist flexors  3/5   5/5   Finger extensors  1/5   5/5   Finger flexors  3/5   5/5   Dorsal interossei  1/5   5/5   Abductor pollicis  1/5   5/5   Tone (Ashworth scale)  0  0   Lower Extremity:  Right  Left  Hip flexors  5/5   5/5   Hip extensors  5/5   5/5   Adductor 5/5  5/5  Abductor 5/5  5/5  Knee flexors  5/5   5/5   Knee extensors  5/5   5/5   Dorsiflexors  5/5   5/5   Plantarflexors  5/5   5/5   Toe extensors  5/5   5/5   Toe flexors  5/5   5/5   Tone (Ashworth scale)  0  0    MSRs:                                           Right         Left brachioradialis 2+  2+  biceps 2+  2+  triceps 2+  2+  patellar 2+  2+  ankle jerk 2+  2+  plantar response down  down   SENSORY: Temperature and pin prick reduced over  the dorsum of the right hand, sensation intact in the forearm and palm. Left hand and bilateral leg sensation is normal.   COORDINATION/GAIT:  Normal finger-to- nose-finger.  Finger tapping is not possible on the right hand due to weakness.  Normal finger tapping on the left.  Gait narrow based and stable.    Data: NCS/EMG of the upper extremities 11/12/2020: This is a complex study.  The electrophysiologic findings are most suggestive of an asymmetric sensorimotor polyneuropathy, with axonal and demyelinating features, affecting the upper extremities (worse on the right), which has progressed compared to study on 04/29/2020.  Recommend further evaluation for polyradiculoneuropathy.  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 Her exam shoes multiple nerve involvement including the radial, ulnar, and median nerves.  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  To evaluate for polyradiculoneuropathy, she underwent CSF testing which shows 4 oligoclonal bands in the CSF, not present in the serum, indicative of intrathecal protein synthesis as seen with inflammation/immune-mediated process.  There are no signs of CSF infection.  CSF protein was normal, which argues against CIDP.  Mononeuritis multiplex is also a consideration with the severity of pain at onset and multiple nerves involved.  Because of the severity of hand weakness and oligoclonal  bands present in CSF concerning for immune-mediated neuropathy, I will offer a trial of high dose steroids. Patient is in agreement with the plan.   Regarding her new right knee swelling and pain, I am not sure if this is related to inflammatory neuropathy, however, I will screen for additional autoimmune antibodies and refer to rheumatology for their opinion.    PLAN/RECOMMENDATIONS:  Check ESR, CRP, RF, ANA, SSA/B, cryoglobulins, c/p ANCA, CCP, anti-MAG, heavy metal panel Continue gabapentin 666m three times daily Start solumedrol 1g daily x 5 days.  Risks and benefits discussed. Refer to rheumatology for right knee pain and swelling in the setting of possible immune-mediated neuropathy  Return to clinic in 6-8 weeks  Total time spent reviewing records, interview, history/exam, documentation, counseling, and coordination of care on day of encounter:  40 min  Thank you for allowing me to participate in patient's care.  If I can answer any additional questions, I would be pleased to do so.    Sincerely,    Lazlo Tunney K. PPosey Pronto DO

## 2020-12-30 NOTE — Telephone Encounter (Signed)
This is great news.  OK for her to get Tu/W/Th and then again then two treatments the following week.  Thanks.

## 2020-12-30 NOTE — Telephone Encounter (Signed)
Called patient and informed her that [facility] does do Infusion on Tuesday/Wednesdays/Thursdays and informed her that Dr. Posey Pronto is ok with splitting up her 5 days in a span of 2 weeks. Patient is aware that someone will be contacting her to schedule her infusion once we get her paperwork signed and faxed in. Patient had no further questions or concerns.

## 2021-01-02 LAB — HEAVY METALS, BLOOD
Arsenic: 2 ug/L (ref 0–9)
Lead, Blood: 1 ug/dL (ref 0.0–3.4)
Mercury: <1 ug/L (ref 0.0–14.9)

## 2021-01-02 LAB — SPECIMEN STATUS REPORT

## 2021-01-04 LAB — RHEUMATOID FACTOR: Rheumatoid fact SerPl-aCnc: <14 IU/mL (ref <14)

## 2021-01-04 LAB — CRYOGLOBULIN

## 2021-01-04 LAB — SJOGREN'S SYNDROME ANTIBODS(SSA + SSB)
SSA (Ro) (ENA) Antibody, IgG: <1 AI
SSB (La) (ENA) Antibody, IgG: <1 AI

## 2021-01-04 LAB — ANCA SCREEN W REFLEX TITER: ANCA Screen: NEGATIVE

## 2021-01-04 LAB — ANA: Anti Nuclear Antibody (ANA): NEGATIVE

## 2021-01-05 LAB — MAG IGM ANTIBODIES: MAG IgM Antibodies: <900 BTU (ref 0–999)

## 2021-01-05 LAB — MAG INTERPRETATION REFLEXED

## 2021-01-12 ENCOUNTER — Encounter (HOSPITAL_COMMUNITY)
Admission: RE | Admit: 2021-01-12 | Discharge: 2021-01-12 | Disposition: A | Discharge status: Home / Self Care | Payer: Medicare PPO | Source: Ambulatory Visit | Attending: Neurology | Admitting: Neurology

## 2021-01-12 ENCOUNTER — Other Ambulatory Visit: Payer: Self-pay

## 2021-01-12 DIAGNOSIS — G619 Inflammatory polyneuropathy, unspecified: Secondary | ICD-10-CM | POA: Diagnosis not present

## 2021-01-12 MED ORDER — SODIUM CHLORIDE 0.9 % IV SOLN: Freq: Once | INTRAVENOUS | Status: AC

## 2021-01-12 MED ORDER — SODIUM CHLORIDE 0.9 % IV SOLN: Freq: Once | INTRAVENOUS | Status: DC

## 2021-01-12 MED ORDER — SODIUM CHLORIDE 0.9 % IV SOLN
1000.0000 mg | Freq: Once | INTRAVENOUS | Status: AC
  Administered 2021-01-12: 1000 mg via INTRAVENOUS
  Filled 2021-01-12: qty 16

## 2021-01-12 MED ORDER — SODIUM CHLORIDE 0.9 % IV SOLN: 1000.0000 mg | Freq: Once | INTRAVENOUS | Status: DC

## 2021-01-12 MED ORDER — METHYLPREDNISOLONE SODIUM SUCC 1000 MG IJ SOLR: 1000.0000 mg | Freq: Once | INTRAMUSCULAR | Status: DC

## 2021-01-13 ENCOUNTER — Encounter (HOSPITAL_COMMUNITY)
Admission: RE | Admit: 2021-01-13 | Discharge: 2021-01-13 | Disposition: A | Discharge status: Home / Self Care | Payer: Medicare PPO | Source: Ambulatory Visit | Attending: Neurology | Admitting: Neurology

## 2021-01-13 DIAGNOSIS — G619 Inflammatory polyneuropathy, unspecified: Secondary | ICD-10-CM | POA: Diagnosis not present

## 2021-01-13 MED ORDER — SODIUM CHLORIDE 0.9 % IV SOLN: INTRAVENOUS | Status: DC

## 2021-01-13 MED ORDER — SODIUM CHLORIDE 0.9 % IV SOLN
1000.0000 mg | Freq: Once | INTRAVENOUS | Status: AC
  Administered 2021-01-13: 12:00:00 1000 mg via INTRAVENOUS
  Filled 2021-01-13: qty 16

## 2021-01-14 ENCOUNTER — Encounter (HOSPITAL_COMMUNITY): Payer: Self-pay

## 2021-01-14 ENCOUNTER — Encounter (HOSPITAL_COMMUNITY)
Admission: RE | Admit: 2021-01-14 | Discharge: 2021-01-14 | Disposition: A | Discharge status: Home / Self Care | Payer: Medicare PPO | Source: Ambulatory Visit | Attending: Neurology | Admitting: Neurology

## 2021-01-14 ENCOUNTER — Other Ambulatory Visit: Payer: Self-pay

## 2021-01-14 DIAGNOSIS — G619 Inflammatory polyneuropathy, unspecified: Secondary | ICD-10-CM | POA: Diagnosis not present

## 2021-01-14 MED ORDER — SODIUM CHLORIDE 0.9 % IV SOLN: Freq: Once | INTRAVENOUS | Status: AC

## 2021-01-14 MED ORDER — SODIUM CHLORIDE 0.9 % IV SOLN
1000.0000 mg | Freq: Once | INTRAVENOUS | Status: AC
  Administered 2021-01-14: 1000 mg via INTRAVENOUS
  Filled 2021-01-14: qty 16

## 2021-01-19 ENCOUNTER — Encounter (HOSPITAL_COMMUNITY)
Admission: RE | Admit: 2021-01-19 | Discharge: 2021-01-19 | Disposition: A | Discharge status: Home / Self Care | Payer: Medicare PPO | Source: Ambulatory Visit | Attending: Neurology | Admitting: Neurology

## 2021-01-19 ENCOUNTER — Other Ambulatory Visit: Payer: Self-pay

## 2021-01-19 ENCOUNTER — Encounter (HOSPITAL_COMMUNITY): Payer: Self-pay

## 2021-01-19 DIAGNOSIS — G619 Inflammatory polyneuropathy, unspecified: Secondary | ICD-10-CM | POA: Diagnosis not present

## 2021-01-19 MED ORDER — SODIUM CHLORIDE 0.9 % IV SOLN
1000.0000 mg | Freq: Once | INTRAVENOUS | Status: AC
  Administered 2021-01-19: 13:00:00 1000 mg via INTRAVENOUS
  Filled 2021-01-19: qty 16

## 2021-01-19 MED ORDER — SODIUM CHLORIDE 0.9 % IV SOLN: Freq: Once | INTRAVENOUS | Status: AC

## 2021-01-20 ENCOUNTER — Encounter (HOSPITAL_COMMUNITY)
Admission: RE | Admit: 2021-01-20 | Discharge: 2021-01-20 | Disposition: A | Discharge status: Home / Self Care | Payer: Medicare PPO | Source: Ambulatory Visit | Attending: Neurology | Admitting: Neurology

## 2021-01-20 DIAGNOSIS — G619 Inflammatory polyneuropathy, unspecified: Secondary | ICD-10-CM | POA: Diagnosis not present

## 2021-01-20 MED ORDER — SODIUM CHLORIDE 0.9 % IV SOLN: Freq: Once | INTRAVENOUS | Status: AC

## 2021-01-20 MED ORDER — SODIUM CHLORIDE 0.9 % IV SOLN
1000.0000 mg | Freq: Once | INTRAVENOUS | Status: AC
  Administered 2021-01-20: 12:00:00 1000 mg via INTRAVENOUS
  Filled 2021-01-20: qty 16

## 2021-02-16 NOTE — Progress Notes (Signed)
Follow-up Visit   Date: 02/17/21   Hailey Bates MRN: [id number] DOB: 03-19-1955   Interim History: Hailey Bates is a 66 y.o. right-handed Caucasian female with hypertension and insomnia returning to the clinic with new complaints of right hand weakness.  She was previously seen in April 2022 for left arm weakness.  The patient was accompanied to the clinic by self.  History of present illness: In September 2021, she woke up in the middle of the night with left proximal arm throbbing pain.  She was given steroid injection for possible muscle strain. Because of unrelenting pain, she was given 5-days of prednisone which helped her pain when taking the steroids, but then returned once she stopped it.  She was seen by orthopeadics who felt she may have high nerve entrapment. She completed physical therapy which did not help her symptoms.  By November 2021, she was having tingling and sharp pain over upper lateral arm, forearm, and dorsum of the thumb, index finger, and middle finger.  She has some improvement in pain with gabapentin 337m three times daily.  She has weakness in the left hand and arm. MRI cervical spine shows cervical spondylosis with neural foraminal stenosis at C5-6 bilaterally, which would not explain symptoms, so she was referred for EMG.  EDX from April 2022 showed sensorimotor polyneuropathy affecting both arms. Of note, since February, she feels that her pain and weakness has stabilized and there has been no further progression.   Her brother and mother had burning pain in the feet, neither are diabetic.  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  [place]  No history of diabetes or heavy alcohol use.   UPDATE 11/25/2020: She is accompanied by her cousin, Hailey Bates At her last visit in April, I recommended CSF testing, however, patient tells  me that her left arm weakness started to improve, so she decided not to proceed with testing.  She completed OT and had complete return of left arm strength.  She was doing well until 09/25/2020, when she slipped off her bed and hit her right arm against the nightstand. About two days later, she began having excruciating pain, described as achy/throbbing involving the upper arm and forearm.  No associated tingling or numbness or weakness at onset.  About three weeks later, she began having weakness in the right hand, where she was unable to open or make a fist on the right hand.  She has tried ibuprofen and tylenol which helps her pain.  She continues to have ongoing right hand weakness and entire arm pain.  She is unable to use the hand.  She was referred for EMG by Dr. GVanetta Shawloffice which shows asymmetric sensorimotor polyneuropathy, with axonal and demyelinating features, affecting the upper extremities (worse on the right), which has progressed compared to study on 04/29/2020.  She is here for further evaluation.  UPDATE 12/30/2020:  She is here for follow-up visit to discuss the results of her CSF testing.  CSF protein and IgG index was normal, however there were 4 oligoclonal bands in the CSF which was not present in the serum.  Clinically, there has been no significant improvement or worsening of right hand weakness, she is still not able to use the hand at all.  Over the past 1-2 weeks, she has new right knee swelling and pain, especially if she  stands on it for prolonged period of time.  She continues to have mild swelling of the right hand and fingers. Since increasing gabapentin to 638m TID, her pain has significantly improved. MRI brain was normal.  UPDATE 02/16/2021:  She is here for follow-up visit. She was given a trial of solumedrol 1g x5 days in early December.  She feels much better after taking steroids, especially with more energy and improved mood.  She has noticed trace improvement in hand  weakness.  She is able to flex the fingers slightly more.  The hand is still essentially nonfunctional. No worsening symptoms or new neurological complaints.   Medications:  Current Outpatient Medications on File Prior to Visit  Medication Sig Dispense Refill   amLODipine-olmesartan (AZOR) 5-20 MG tablet Take 1 tablet by mouth daily.     celecoxib (CELEBREX) 100 MG capsule Take 100 mg by mouth daily.     cyclobenzaprine (FLEXERIL) 10 MG tablet Take 10 mg by mouth daily as needed. Takes for muscle spasms     gabapentin (NEURONTIN) 300 MG capsule Take 6048mthree times daily.  OK to take an extra as needed. 210 capsule 1   HYDROcodone-acetaminophen (NORCO) 5-325 MG per tablet Take 1 tablet by mouth every 6 (six) hours as needed. Takes for pain     zolpidem (AMBIEN) 10 MG tablet Take 10 mg by mouth at bedtime.     No current facility-administered medications on file prior to visit.    Allergies: No Known Allergies  Vital Signs:  BP 116/80    Pulse 80    Ht _0  (1.626 m)    Wt 197 lb (89.4 kg)    SpO2 98%    BMI 33.81 kg/m   Neurological Exam: MENTAL STATUS including orientation to time, place, person, recent and remote memory, attention span and concentration, language, and fund of knowledge is normal.  Speech is not dysarthric.  CRANIAL NERVES:   Normal conjugate, extra-ocular eye movements in all directions of gaze.  No ptosis.     MOTOR:  Moderate right hand and forearm atrophy. No fasciculations or abnormal movements.  No pronator drift.   Upper Extremity:  Right  Left  Deltoid  5/5   5/5   Biceps  5/5   5/5   Triceps * 4/5   5/5   Wrist extensors  1/5   5/5   Wrist flexors  3/5   5/5   Finger extensors  1/5   5/5   Finger flexors  3+/5   5/5   Dorsal interossei  1/5   5/5   Abductor pollicis  1/5   5/5   Tone (Ashworth scale)  0  0   Lower Extremity:  Right  Left  Hip flexors  5/5   5/5   Hip extensors  5/5   5/5   Adductor 5/5  5/5  Abductor 5/5  5/5  Knee flexors   5/5   5/5   Knee extensors  5/5   5/5   Dorsiflexors  5/5   5/5   Plantarflexors  5/5   5/5   Toe extensors  5/5   5/5   Toe flexors  5/5   5/5   Tone (Ashworth scale)  0  0    MSRs:  Right        Left brachioradialis 2+  2+  biceps 2+  2+  triceps 2+  2+  patellar 2+  2+  ankle jerk 2+  2+  plantar response down  down   SENSORY: Temperature reduced over the dorsum and palm of the right hand, sensation intact in the forearm. Left hand and bilateral leg sensation is normal.   COORDINATION/GAIT:  Normal finger-to- nose-finger.  She is able to approximate the right thumb to the index finger, which is better than before.  Normal finger tapping on the left.  Gait narrow based and stable.    Data: NCS/EMG of the upper extremities 11/12/2020: This is a complex study.  The electrophysiologic findings are most suggestive of an asymmetric sensorimotor polyneuropathy, with axonal and demyelinating features, affecting the upper extremities (worse on the right), which has progressed compared to study on 04/29/2020.  Recommend further evaluation for polyradiculoneuropathy.  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 Her exam shoes multiple nerve involvement including the radial, ulnar, and median nerves.  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  CSF testing shows 4 oligoclonal bands in the CSF, not present in the serum, indicative of intrathecal protein  synthesis as seen with inflammation/immune-mediated process.  There are no signs of CSF infection.  CSF protein was normal, which argues against CIDP.  Mononeuritis multiplex is also a consideration with the severity of pain at onset and multiple nerves involved.  Because of the severity of hand weakness and oligoclonal bands present in CSF concerning for immune-mediated neuropathy, she was offered a trial of Solumedrol.  She reports minimal improvement.  I will start her on a maintenance dose to see if there is any benefit as sometimes it will takes several treatments months to see improvement.   PLAN/RECOMMENDATIONS:  Start solumedrol 1g every 28 days x 8 months Continue gabapentin 633m three times daily Start occupational therapy She will be seeing rheumatology later this week for right knee pain and swelling in the setting of possible immune-mediated neuropathy  Return to clinic in 3 months  Thank you for allowing me to participate in patient's care.  If I can answer any additional questions, I would be pleased to do so.    Sincerely,    Sherri Mcarthy K. PPosey Pronto DO

## 2021-02-17 ENCOUNTER — Other Ambulatory Visit: Payer: Self-pay

## 2021-02-17 ENCOUNTER — Encounter: Payer: Self-pay | Admitting: Neurology

## 2021-02-17 ENCOUNTER — Ambulatory Visit: Payer: Medicare PPO | Admitting: Neurology

## 2021-02-17 VITALS — BP 116/80 | HR 80 | Ht 64.0 in | Wt 197.0 lb

## 2021-02-17 DIAGNOSIS — R29898 Other symptoms and signs involving the musculoskeletal system: Secondary | ICD-10-CM

## 2021-02-17 DIAGNOSIS — G619 Inflammatory polyneuropathy, unspecified: Secondary | ICD-10-CM

## 2021-02-17 NOTE — Patient Instructions (Addendum)
Start Solumedrol 1g every 28 days Start hand therapy -  we will try to look into a center closer to home  Return to clinic in 3 months

## 2021-02-17 NOTE — Addendum Note (Signed)
Addended by: Armen Pickup A on: 02/17/2021 01:58 PM   Modules accepted: Orders

## 2021-02-18 NOTE — Progress Notes (Signed)
Office Visit Note  Patient: Hailey Bates             Date of Birth: 1955-05-21           MRN: [id number]             PCP: Adaline Sill, NP Referring: Adaline Sill, NP Visit Date: 02/19/2021   Subjective:   History of Present Illness: Hailey Bates is a 66 y.o. female here for evaluation of right knee pain and swelling and elevated sedimentation rate. She has ongoing problems treated with steroids for asymmetric inflammatory polyneuropathy with symptoms that started intermittently in 2021 first on left side but now more problems in right. She has right arm weakness and right wrist drop.  Currently taking treatment with solumedrol so far right hand remains very weak.  She has chronic swelling in the right hand that started after becoming weak. She doesn't notice much redness or warmth changes, no contracture or loss of passive mobility. She wears a brace at night to keep fingers extended and is starting with OT next week.  More recently right knee swelling and increased pain started bothering her. This improves after sleeping but gets worse during the day with prolonged sitting. She notices cracking or popping of the knee frequently but no locking up and no instability. She takes celebrex daily for years for osteoarthritis pain which remains partially helpful for the knee. After solumedrol doses the knee swelling decreases temporarily.  Labs reviewed 12/2020 ANA neg SSA, SSB neg RF neg ANCA neg ESR 57 CRP <1 CSF unremarkable  Imaging reviewed 12/05/20 MRI brain normal  Activities of Daily Living:  Patient reports morning stiffness for 1 hour.   Patient Reports nocturnal pain.  Difficulty dressing/grooming: Reports Difficulty climbing stairs: Reports Difficulty getting out of chair: Reports Difficulty using hands for taps, buttons, cutlery, and/or writing: Reports  Review of Systems  Constitutional:  Positive for fatigue.  HENT:  Negative for mouth sores, mouth  dryness and nose dryness.   Eyes:  Negative for pain, itching and dryness.  Respiratory:  Negative for shortness of breath and difficulty breathing.   Cardiovascular:  Negative for chest pain and palpitations.  Gastrointestinal:  Negative for blood in stool, constipation and diarrhea.  Endocrine: Negative for increased urination.  Genitourinary:  Negative for difficulty urinating.  Musculoskeletal:  Positive for joint pain, joint pain, joint swelling and morning stiffness. Negative for myalgias, muscle tenderness and myalgias.  Skin:  Negative for color change, rash and redness.  Allergic/Immunologic: Negative for susceptible to infections.  Neurological:  Positive for numbness and headaches. Negative for dizziness, memory loss and weakness.  Hematological:  Positive for bruising/bleeding tendency.  Psychiatric/Behavioral:  Negative for confusion.    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   Pain in right hand 09/14/2020   Pain in left arm 08/24/2020    Past Medical History:  Diagnosis Date   Hypertension    Neuromuscular disorder ([place])    sciatica    Family History  Problem Relation Age of Onset   Hypertension Mother  Diabetes Father    Hypertension Father    Heart disease Brother    Arthritis/Rheumatoid Brother    Healthy Daughter    Past Surgical History:  Procedure Laterality Date   ANTERIOR AND POSTERIOR REPAIR  01/04/2011   Procedure: ANTERIOR (CYSTOCELE) AND POSTERIOR REPAIR (RECTOCELE);  Surgeon: Reece Packer, MD;  Location: [facility];  Service: Urology;  Laterality: N/A;  anterior repair/cysto, with graft,no posterior repair   BLADDER SUSPENSION  01/04/2011   Procedure: [place];  Surgeon: Reece Packer, MD;  Location: [facility];  Service: Urology;  Laterality: N/A;   CARDIAC CATHETERIZATION     CYSTOSCOPY  01/04/2011   Procedure: CYSTOSCOPY;  Surgeon: Reece Packer, MD;  Location: [facility];  Service: Urology;  Laterality: N/A;   NASAL SINUS SURGERY     VAGINAL HYSTERECTOMY  01/04/2011   Procedure: HYSTERECTOMY VAGINAL;  Surgeon: Lubertha South Romine;  Location: [facility];  Service: Gynecology;  Laterality: N/A;   VAGINAL PROLAPSE REPAIR  01/04/2011   Procedure: VAGINAL VAULT SUSPENSION;  Surgeon: Reece Packer, MD;  Location: [facility];  Service: Urology;  Laterality: N/A;   Social History   Social History Narrative   Right Handed    Lives in a one story home   Drinks Caffeine    Immunization History  Administered Date(s) Administered   Influenza,inj,Quad PF,6+ Mos 10/29/2020   Influenza,trivalent, recombinat, inj, PF 12/05/2016   Moderna Sars-Covid-2 Vaccination 02/26/2019, 03/26/2019, 12/18/2019     Objective: Vital Signs: BP 130/80 (BP Location: Left Arm, Patient Position: Sitting, Cuff Size: Large)    Pulse 74    Ht 5' 3.5" (1.613 m)    Wt 201 lb 3.2 oz (91.3 kg)    BMI 35.08 kg/m    Physical Exam Constitutional:      Appearance: She is obese.  Cardiovascular:     Rate and Rhythm: Normal rate and regular rhythm.  Pulmonary:     Effort: Pulmonary effort is normal.     Breath sounds: Normal breath sounds.  Skin:    General: Skin is warm and dry.     Findings: No rash.  Neurological:     Mental Status: She is alert.     Motor: Weakness present.     Comments: Right arm wrist and finger extension deficit, mild muscle atrophy present in forearm and hand  Psychiatric:        Mood and Affect: Mood normal.     Musculoskeletal Exam:  Neck full ROM no tenderness Right shoulder unable to  actively abduct overhead passive ROM is intact Elbows full ROM right elbow tenderness to pressure Right wrist and fingers unable to actively extend but passive movement is preserved, no palpable contractures, there is shiny appearance of skin without appreciable thickening and no discoloration Right knee effusion present, warm to touch, slightly decreased ROM, medial joint line tenderness Ankles full ROM no tenderness or swelling   Investigation: No additional findings.  Imaging: No results found.  Recent Labs: Lab Results  Component Value Date   WBC 12.8 (H) 01/05/2011   HGB 9.8 (L) 01/05/2011   PLT 211 01/05/2011   NA 137 12/22/2010   K 3.7 12/22/2010   CL 102 12/22/2010   CO2 25 12/22/2010   GLUCOSE 89 12/22/2010   BUN 20 12/22/2010   CREATININE 0.72 12/22/2010   PROT 7.4 05/11/2020   ALBUMIN 4.0 12/15/2020   CALCIUM 10.3 12/22/2010   GFRAA >90 12/22/2010    Speciality Comments: No specialty comments available.  Procedures:  Large Joint Inj: R knee on 02/19/2021 9:40 AM Indications: joint swelling, diagnostic evaluation and pain Details: 22 G 1.5 in needle, superolateral approach Medications: 4 mL lidocaine 1 %; 40 mg triamcinolone acetonide 40 MG/ML Aspirate: 47 mL clear and yellow; sent for lab analysis Outcome: tolerated well, no immediate complications Procedure, treatment alternatives, risks and benefits explained, specific risks discussed. Consent was given by the patient. Immediately prior to procedure a time out was called to verify the correct patient, procedure, equipment, support staff and site/side marked as required. Patient was prepped and draped in the usual sterile fashion.    Allergies: Patient has no known allergies.   Assessment / Plan:     Visit Diagnoses: Pain and swelling of right knee - Plan: XR KNEE 3 VIEW RIGHT, Synovial Fluid Analysis, Complete, Large Joint Inj: R knee  Joint pain and swelling worst at the right knee x-ray obtained in  clinic today showing mild osteoarthritis with lateral osteophyte formation but joint space and range of movement is pretty good.  Seems unusual to be related with systemic inflammatory polyneuropathy with 1 joint involvement.  Obviously swollen and warm to the touch knee aspiration and steroid injection performed in clinic today goal to differentiate inflammatory arthritis cause versus possibly OA with effusion.  Pain in right hand  Right hand pain weakness with mild swelling and skin changes looks to be associated with denervation appreciable interosseous and forearm muscle atrophy on exam.  Currently no significant joint contractures no palpable synovitis.  Inflammatory neuropathy (H[facility]) Sedimentation rate elevation  Currently treating with steroids with Dr. Posey Pronto and is scheduled for OT work for hand weakness in particular. No specific autoantibodies or other systemic symptoms for connective tissue disease.  Orders: Orders Placed This Encounter  Procedures   Large Joint Inj: R knee   XR KNEE 3 VIEW RIGHT   Synovial Fluid Analysis, Complete   No orders of the defined types were placed in this encounter.    Follow-Up Instructions: No follow-ups on file.   Collier Salina, MD  Note - This record has been created using [employer].  Chart creation errors have been sought, but may not always  have been located. Such creation errors do not reflect on  the standard of medical care.

## 2021-02-18 NOTE — Addendum Note (Signed)
Addended by: Armen Pickup A on: 02/18/2021 09:54 AM   Modules accepted: Orders

## 2021-02-19 ENCOUNTER — Other Ambulatory Visit: Payer: Self-pay

## 2021-02-19 ENCOUNTER — Ambulatory Visit: Payer: Medicare PPO | Admitting: Internal Medicine

## 2021-02-19 ENCOUNTER — Ambulatory Visit (INDEPENDENT_AMBULATORY_CARE_PROVIDER_SITE_OTHER): Payer: Medicare PPO

## 2021-02-19 ENCOUNTER — Encounter: Payer: Self-pay | Admitting: Internal Medicine

## 2021-02-19 VITALS — BP 130/80 | HR 74 | Ht 63.5 in | Wt 201.2 lb

## 2021-02-19 DIAGNOSIS — M79641 Pain in right hand: Secondary | ICD-10-CM | POA: Diagnosis not present

## 2021-02-19 DIAGNOSIS — F5101 Primary insomnia: Secondary | ICD-10-CM | POA: Insufficient documentation

## 2021-02-19 DIAGNOSIS — M25561 Pain in right knee: Secondary | ICD-10-CM

## 2021-02-19 DIAGNOSIS — M5136 Other intervertebral disc degeneration, lumbar region: Secondary | ICD-10-CM | POA: Insufficient documentation

## 2021-02-19 DIAGNOSIS — G894 Chronic pain syndrome: Secondary | ICD-10-CM | POA: Insufficient documentation

## 2021-02-19 DIAGNOSIS — G619 Inflammatory polyneuropathy, unspecified: Secondary | ICD-10-CM

## 2021-02-19 DIAGNOSIS — M25461 Effusion, right knee: Secondary | ICD-10-CM | POA: Diagnosis not present

## 2021-02-19 DIAGNOSIS — R700 Elevated erythrocyte sedimentation rate: Secondary | ICD-10-CM | POA: Diagnosis not present

## 2021-02-19 DIAGNOSIS — I10 Essential (primary) hypertension: Secondary | ICD-10-CM | POA: Insufficient documentation

## 2021-02-19 DIAGNOSIS — F419 Anxiety disorder, unspecified: Secondary | ICD-10-CM | POA: Insufficient documentation

## 2021-02-19 LAB — SYNOVIAL FLUID ANALYSIS, COMPLETE
Basophils, %: 0 %
Eosinophils-Synovial: 0 % (ref 0–2)
Lymphocytes-Synovial Fld: 49 % (ref 0–74)
Monocyte/Macrophage: 47 % (ref 0–69)
Neutrophil, Synovial: 4 % (ref 0–24)
Synoviocytes, %: 0 % (ref 0–15)
WBC, Synovial: 251 cells/uL — ABNORMAL HIGH (ref <150)

## 2021-02-20 MED ORDER — TRIAMCINOLONE ACETONIDE 40 MG/ML IJ SUSP
40.0000 mg | INTRAMUSCULAR | Status: AC | PRN
  Administered 2021-02-19: 40 mg via INTRA_ARTICULAR

## 2021-02-20 MED ORDER — LIDOCAINE HCL 1 % IJ SOLN
4.0000 mL | INTRAMUSCULAR | Status: AC | PRN
  Administered 2021-02-19: 4 mL

## 2021-02-22 NOTE — Progress Notes (Signed)
Synovial fluid looks very bland 251 total WBCs. I spoke with Hailey Bates her result probably indicates OA with effusion rather than part of an inflammatory process. If symptoms return right away or get worse we can follow up as needed to reassess that might warrant more advanced imaging workup.

## 2021-02-25 ENCOUNTER — Other Ambulatory Visit: Payer: Self-pay

## 2021-02-25 ENCOUNTER — Encounter (HOSPITAL_COMMUNITY): Payer: Self-pay

## 2021-02-25 ENCOUNTER — Encounter (HOSPITAL_COMMUNITY)
Admission: RE | Admit: 2021-02-25 | Discharge: 2021-02-25 | Disposition: A | Discharge status: Home / Self Care | Payer: Medicare PPO | Source: Ambulatory Visit | Attending: Neurology | Admitting: Neurology

## 2021-02-25 DIAGNOSIS — G619 Inflammatory polyneuropathy, unspecified: Secondary | ICD-10-CM | POA: Diagnosis present

## 2021-02-25 MED ORDER — SODIUM CHLORIDE 0.9 % IV SOLN: Freq: Once | INTRAVENOUS | Status: AC

## 2021-02-25 MED ORDER — SODIUM CHLORIDE 0.9 % IV SOLN
1000.0000 mg | Freq: Once | INTRAVENOUS | Status: AC
  Administered 2021-02-25: 1000 mg via INTRAVENOUS
  Filled 2021-02-25: qty 16

## 2021-03-12 ENCOUNTER — Ambulatory Visit (HOSPITAL_COMMUNITY): Payer: Medicare PPO | Attending: Neurology | Admitting: Occupational Therapy

## 2021-03-12 ENCOUNTER — Encounter (HOSPITAL_COMMUNITY): Payer: Self-pay | Admitting: Occupational Therapy

## 2021-03-12 NOTE — Therapy (Signed)
[facility] [place], [place], [address] Phone: [phone]   Fax:  [phone]  Patient Details  Name: Hailey Bates MRN: [id number] Date of Birth: 14-Aug-1955 Referring Provider:  No ref. provider found  Encounter Date: 03/12/2021   OCCUPATIONAL THERAPY DISCHARGE SUMMARY  Visits from Start of Care: 2  Current functional level related to goals / functional outcomes: Unknown. Pt did not call or return to clinic after last visit in 12/2020.   Remaining deficits: Unknown.    Education / Equipment: Splint wear/care   Patient agrees to discharge. Patient goals were not met. Patient is being discharged due to not returning since the last visit.Marland Kitchen     Guadelupe Sabin, OTR/L  [phone] 03/12/2021, 1:00 PM  [facility] [address] [place], [place], [address] Phone: [phone]   Fax:  [phone]

## 2021-03-16 ENCOUNTER — Ambulatory Visit: Payer: Medicare PPO | Admitting: Internal Medicine

## 2021-03-25 ENCOUNTER — Encounter (HOSPITAL_COMMUNITY): Payer: Self-pay

## 2021-03-25 ENCOUNTER — Encounter (HOSPITAL_COMMUNITY)
Admission: RE | Admit: 2021-03-25 | Discharge: 2021-03-25 | Disposition: A | Discharge status: Home / Self Care | Payer: Medicare PPO | Source: Ambulatory Visit | Attending: Neurology | Admitting: Neurology

## 2021-03-25 DIAGNOSIS — G619 Inflammatory polyneuropathy, unspecified: Secondary | ICD-10-CM | POA: Insufficient documentation

## 2021-03-25 MED ORDER — SODIUM CHLORIDE 0.9 % IV SOLN
1000.0000 mg | Freq: Once | INTRAVENOUS | Status: AC
  Administered 2021-03-25: 1000 mg via INTRAVENOUS
  Filled 2021-03-25: qty 16

## 2021-03-25 MED ORDER — SODIUM CHLORIDE 0.9 % IV SOLN: Freq: Once | INTRAVENOUS | Status: AC

## 2021-04-11 NOTE — Progress Notes (Signed)
? ?Office Visit Note ? ?Patient: Hailey Bates             ?Date of Birth: 04/17/1955           ?MRN: [id number]             ?PCP: Adaline Sill, NP ?Referring: Adaline Sill, NP ?Visit Date: 04/12/2021 ? ? ?Subjective:  ?Pain and Edema of the Right Knee (4 days) ? ? ?History of Present Illness: Hailey Bates is a 66 y.o. female here for follow up for right knee pain and swelling with elevated sed rate joint aspiration with fluid appeared not inflammatory. If symptoms still failing to improve after aspiration injection and treated for her polyneuropathy would consider more advanced imaging as the OA seen did not seem adequate to explain symptoms. She had a good benefit with the knee injection until last week she is noticing increased swelling again since about 4 days ago this time is more painful compared to our visit in January. She did not notice any major change in her right arm neuropathy in the interval but her fatigue improves a lot for most of the 4 weeks after each solumedrol infusion. ? ?Previous HPI ?02/19/21 ? Hailey Bates is a 66 y.o. female here for evaluation of right knee pain and swelling and elevated sedimentation rate. She has ongoing problems treated with steroids for asymmetric inflammatory polyneuropathy with symptoms that started intermittently in 2021 first on left side but now more problems in right. She has right arm weakness and right wrist drop.  Currently taking treatment with solumedrol so far right hand remains very weak.  She has chronic swelling in the right hand that started after becoming weak. She doesn't notice much redness or warmth changes, no contracture or loss of passive mobility. She wears a brace at night to keep fingers extended and is starting with OT next week.  ?More recently right knee swelling and increased pain started bothering her. This improves after sleeping but gets worse during the day with prolonged sitting. She notices cracking or popping of  the knee frequently but no locking up and no instability. She takes celebrex daily for years for osteoarthritis pain which remains partially helpful for the knee. After solumedrol doses the knee swelling decreases temporarily. ?  ?Labs reviewed ?12/2020 ?ANA neg ?SSA, SSB neg ?RF neg ?ANCA neg ?ESR 57 ?CRP <1 ?CSF unremarkable ? ? ?Review of Systems  ?Constitutional:  Negative for fatigue.  ?HENT:  Negative for mouth dryness.   ?Eyes:  Negative for dryness.  ?Respiratory:  Negative for shortness of breath.   ?Cardiovascular:  Negative for swelling in legs/feet.  ?Gastrointestinal:  Negative for constipation.  ?Endocrine: Negative for excessive thirst.  ?Genitourinary:  Negative for difficulty urinating.  ?Musculoskeletal:  Positive for joint pain, gait problem, joint pain, joint swelling, muscle weakness, morning stiffness and muscle tenderness.  ?Skin:  Negative for rash.  ?Allergic/Immunologic: Negative for susceptible to infections.  ?Neurological:  Positive for numbness.  ?Hematological:  Negative for bruising/bleeding tendency.  ?Psychiatric/Behavioral:  Negative for sleep disturbance.   ? ?PMFS History:  ?Patient Active Problem List  ? Diagnosis Date Noted  ? Anxiety 02/19/2021  ? Chronic pain syndrome 02/19/2021  ? Essential hypertension 02/19/2021  ? 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 Primary insomnia 02/19/2021  ? Pain and swelling of right knee 02/19/2021  ? Inflammatory neuropathy ([place]) 02/19/2021  ? Sedimentation rate elevation 02/19/2021  ? 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 Pain in right hand 09/14/2020  ? Pain in left arm 08/24/2020  ?  ?Past Medical History:  ?Diagnosis Date  ? Hypertension   ? Neuromuscular disorder ([place])   ? sciatica  ?  ?Family History  ?Problem Relation Age of Onset  ? Hypertension Mother   ? Diabetes Father   ? Hypertension Father   ? Heart disease Brother   ? Arthritis/Rheumatoid Brother    ? Healthy Daughter   ? ?Past Surgical History:  ?Procedure Laterality Date  ? ANTERIOR AND POSTERIOR REPAIR  01/04/2011  ? Procedure: ANTERIOR (CYSTOCELE) AND POSTERIOR REPAIR (RECTOCELE);  Surgeon: Reece Packer, MD;  Location: [facility];  Service: Urology;  Laterality: N/A;  anterior repair/cysto, with graft,no posterior repair  ? BLADDER SUSPENSION  01/04/2011  ? Procedure: [place] PROCEDURE;  Surgeon: Reece Packer, MD;  Location: [facility];  Service: Urology;  Laterality: N/A;  ? CARDIAC CATHETERIZATION    ? CYSTOSCOPY  01/04/2011  ? Procedure: CYSTOSCOPY;  Surgeon: Reece Packer, MD;  Location: [facility];  Service: Urology;  Laterality: N/A;  ? NASAL SINUS SURGERY    ? VAGINAL HYSTERECTOMY  01/04/2011  ? Procedure: HYSTERECTOMY VAGINAL;  Surgeon: Lubertha South Romine;  Location: [facility];  Service: Gynecology;  Laterality: N/A;  ? VAGINAL PROLAPSE REPAIR  01/04/2011  ? Procedure: VAGINAL VAULT SUSPENSION;  Surgeon: Reece Packer, MD;  Location: [facility];  Service: Urology;  Laterality: N/A;  ? ?Social History  ? ?Social History Narrative  ? Right Handed   ? Lives in a one story home  ? Drinks Caffeine   ? ?Immunization History  ?Administered Date(s) Administered  ? Influenza,inj,Quad PF,6+ Mos 10/29/2020  ? Influenza,trivalent, recombinat, inj, PF 12/05/2016  ? Moderna Sars-Covid-2 Vaccination 02/26/2019, 03/26/2019, 12/18/2019  ?  ? ?Objective: ?Vital Signs: BP 115/79 (BP Location: Left Arm, Patient Position: Sitting, Cuff Size: Normal)   Pulse 80   Resp 16   Ht _0  (1.6 m)   Wt 199 lb (90.3 kg)   BMI 35.25 kg/m?   ? ?Physical Exam ?Constitutional:   ?   Appearance: She is obese.  ?Skin: ?   General: Skin is warm and dry.  ?   Findings: No rash.  ?Neurological:  ?   Mental Status: She is alert.  ?Psychiatric:     ?   Mood and Affect: Mood normal.  ?  ? ?Musculoskeletal Exam:  ?Elbows full ROM no tenderness or swelling ?Wrists full ROM no tenderness or swelling ?Fingers full ROM no tenderness or  swelling ?Right knee chronic enlargement and moderate effusion present, tenderness worst lateral of patellar tendon ?No ankle pain or swelling ? ? ?Investigation: ?No additional findings. ? ?Imaging: ?No results found. ? ?Recent Labs: ?Lab Results  ?Component Value Date  ? WBC 12.8 (H) 01/05/2011  ? HGB 9.8 (L) 01/05/2011  ? PLT 211 01/05/2011  ? NA 137 12/22/2010  ? K 3.7 12/22/2010  ? CL 102 12/22/2010  ? CO2 25 12/22/2010  ? GLUCOSE 89 12/22/2010  ? BUN 20 12/22/2010  ? CREATININE 0.72 12/22/2010  ? PROT 7.4 05/11/2020  ? ALBUMIN 4.0 12/15/2020  ? CALCIUM 10.3 12/22/2010  ? GFRAA >90 12/22/2010  ? ? ?Speciality Comments: No specialty comments available. ? ?Procedures:  ?No procedures performed ?Allergies: Patient has no known allergies.  ? ?Assessment / Plan:     ?Visit Diagnoses: Pain and swelling of right knee ? ?Knee swelling is back within 2  months after aspiration and local steroid injection. Moderate improvement in pain with as needed celebrex and advil but does not tolerate taking these every. I do not think her xray findings of some lateral compartment OA explains the amount of recurrent symptoms. Will send for MRI of the right knee might indicate more specific cause, or if more structural problem may need orthopedic follow up. ? ?Sedimentation rate elevation ? ?I cannot confirm any active rheumatologic disease from exam here and synovial fluid analysis is effectively normal. May be related to her ongoing inflammatory neuropathy so far small benefits with the steroid infusions. ? ? ?Orders: ?Orders Placed This Encounter  ?Procedures  ? MR KNEE RIGHT WO CONTRAST  ? ?No orders of the defined types were placed in this encounter. ? ? ? ?Follow-Up Instructions: No follow-ups on file. ? ? ?Collier Salina, MD ? ?Note - This record has been created using [employer].  ?Chart creation errors have been sought, but may not always  ?have been located. Such creation errors do not reflect on  ?the standard of  medical care. ? ?

## 2021-04-12 ENCOUNTER — Ambulatory Visit: Payer: Medicare PPO | Admitting: Internal Medicine

## 2021-04-12 ENCOUNTER — Other Ambulatory Visit: Payer: Self-pay

## 2021-04-12 ENCOUNTER — Encounter: Payer: Self-pay | Admitting: Internal Medicine

## 2021-04-12 VITALS — BP 115/79 | HR 80 | Resp 16 | Ht 63.0 in | Wt 199.0 lb

## 2021-04-12 DIAGNOSIS — M25461 Effusion, right knee: Secondary | ICD-10-CM

## 2021-04-12 DIAGNOSIS — M25561 Pain in right knee: Secondary | ICD-10-CM

## 2021-04-12 DIAGNOSIS — R700 Elevated erythrocyte sedimentation rate: Secondary | ICD-10-CM

## 2021-04-13 ENCOUNTER — Telehealth: Payer: Self-pay | Admitting: *Deleted

## 2021-04-13 NOTE — Telephone Encounter (Signed)
Does she have the celebrex available? Can take this up to twice daily as needed. I do not recommend any additional steroids since she is having solumedrol infusions, next dose 3/23. She has been taking low dose oxycodone from a different provider more long term, if she needs additional pain medication in the short term for this would recommend contacting their office.

## 2021-04-13 NOTE — Telephone Encounter (Signed)
Patient's right knee pain has gotten worse, difficulty bearing weight. Please advise. ?

## 2021-04-14 NOTE — Telephone Encounter (Signed)
Patient states she does have Celebrex on hand. Patient advised she can take this up to twice daily as needed. Patient states a few years back she was advised not to take it but once daily due to her kidney function. Patient states on the days she does not take Celebrex she takes Ibuprofen. Patient advised Dr. Benjamine Mola does not recommend any additional steroids since she is having solumedrol infusions, next dose 3/23. She has been taking low dose oxycodone from a different provider more long term, if she needs additional pain medication in the short term for this would recommend contacting their office. Patient expressed understanding and states she does not need anything additional.  ?

## 2021-04-15 ENCOUNTER — Other Ambulatory Visit: Payer: Self-pay | Admitting: Neurology

## 2021-04-22 ENCOUNTER — Encounter (HOSPITAL_COMMUNITY): Payer: Self-pay

## 2021-04-22 ENCOUNTER — Encounter (HOSPITAL_COMMUNITY)
Admission: RE | Admit: 2021-04-22 | Discharge: 2021-04-22 | Disposition: A | Discharge status: Home / Self Care | Payer: Medicare PPO | Source: Ambulatory Visit | Attending: Neurology | Admitting: Neurology

## 2021-04-22 DIAGNOSIS — G619 Inflammatory polyneuropathy, unspecified: Secondary | ICD-10-CM | POA: Insufficient documentation

## 2021-04-22 MED ORDER — SODIUM CHLORIDE 0.9 % IV SOLN: INTRAVENOUS | Status: DC

## 2021-04-22 MED ORDER — SODIUM CHLORIDE 0.9 % IV SOLN
1000.0000 mg | Freq: Once | INTRAVENOUS | Status: AC
  Administered 2021-04-22: 1000 mg via INTRAVENOUS
  Filled 2021-04-22: qty 16

## 2021-04-23 ENCOUNTER — Telehealth: Payer: Self-pay

## 2021-04-23 NOTE — Telephone Encounter (Signed)
I hate to here this because I know [facility] was much closer for her.   ?We can refer her to [facility] on [address].  I'll place the orders and they will contact her to schedule. ?

## 2021-04-23 NOTE — Telephone Encounter (Signed)
Received a voicemail from patient that she was told at [facility] that they will no longer be providing infusions there effective 05/03/21. They informed patient that she will be sent to [facility] and she is wanting information so she knows where she needs to go. ? ?

## 2021-04-25 ENCOUNTER — Ambulatory Visit
Admission: RE | Admit: 2021-04-25 | Discharge: 2021-04-25 | Disposition: A | Discharge status: Home / Self Care | Payer: Medicare PPO | Source: Ambulatory Visit | Attending: Internal Medicine | Admitting: Internal Medicine

## 2021-04-25 ENCOUNTER — Other Ambulatory Visit: Payer: Self-pay

## 2021-04-25 DIAGNOSIS — M25461 Effusion, right knee: Secondary | ICD-10-CM

## 2021-04-25 IMAGING — MR MR KNEE*R* W/O CM
4 of 6 series · 20 of 40 positions shown · non-contrast
Comparison: X-ray [DATE].

CLINICAL DATA: Chronic right knee pain since [DATE].

EXAM:
MRI OF THE RIGHT KNEE WITHOUT CONTRAST
TECHNIQUE: Multiplanar, multisequence MR imaging of the knee was performed. No
intravenous contrast was administered.

[Series 3: T2 fat-sat · axial · 4.0mm · 0.50mm/px · z∈[-34,+51]mm · 3 of 22 slices shown (1 of 2)]
[im 5/22]
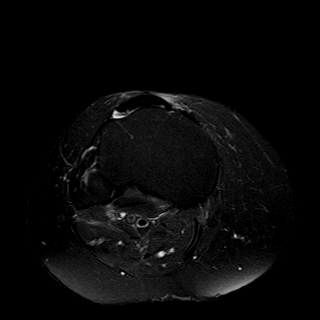
[im 13/22]
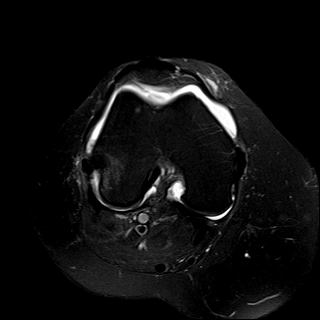
[im 22/22]
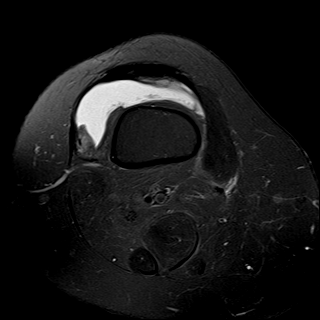

[Series 5: T2 fat-sat · coronal · 4.0mm · 0.29mm/px · 3 of 19 slices shown (2 of 2)]
[im 1/19]
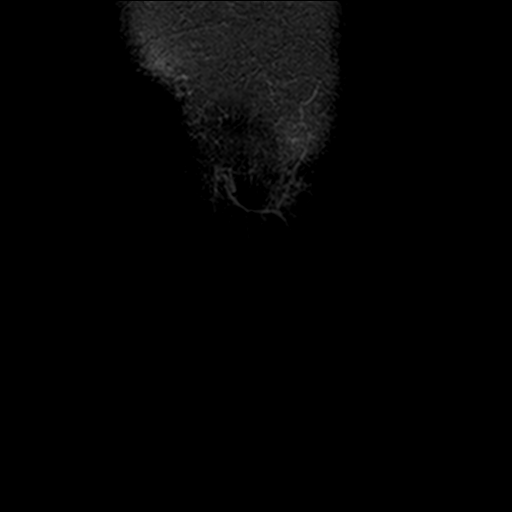
[im 10/19]
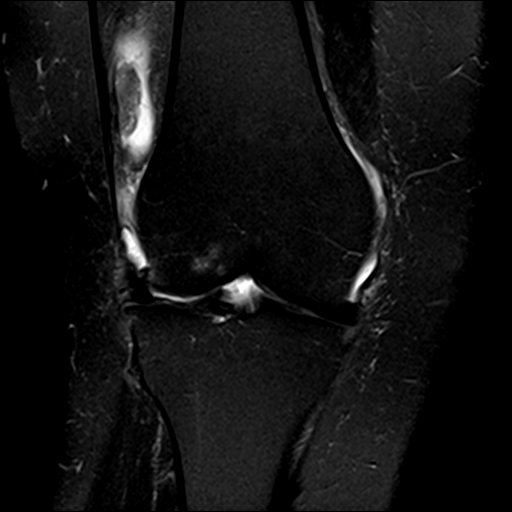
[im 19/19]
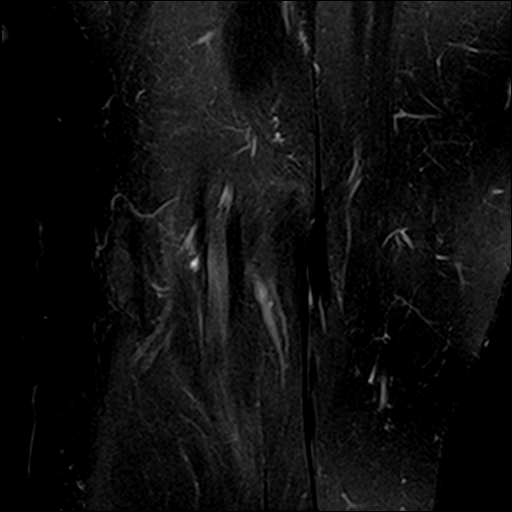

[Series 7: PD fat-sat · sagittal · 3.0mm · 0.29mm/px · 8 of 27 slices shown (1 of 2)]
[im 1/27]
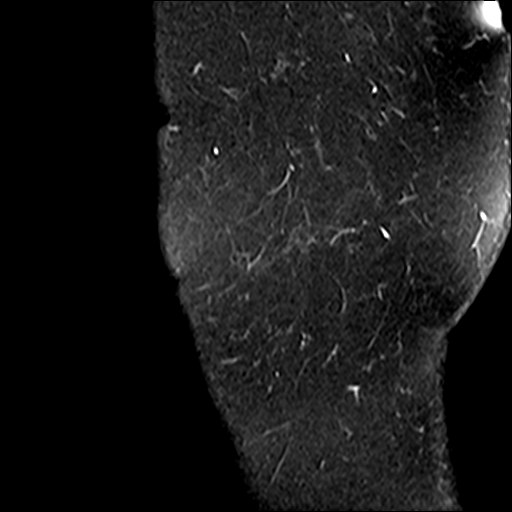
[im 4/27]
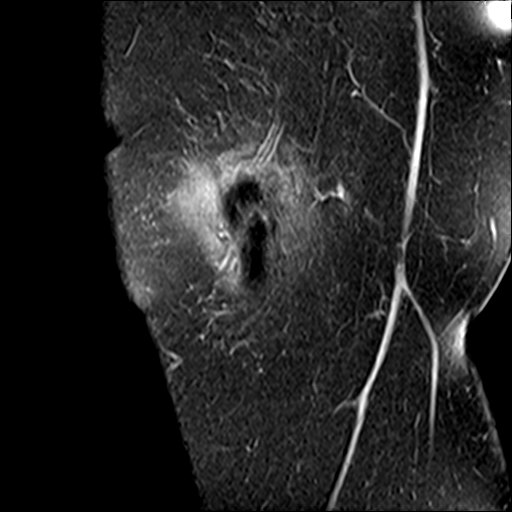
[im 8/27]
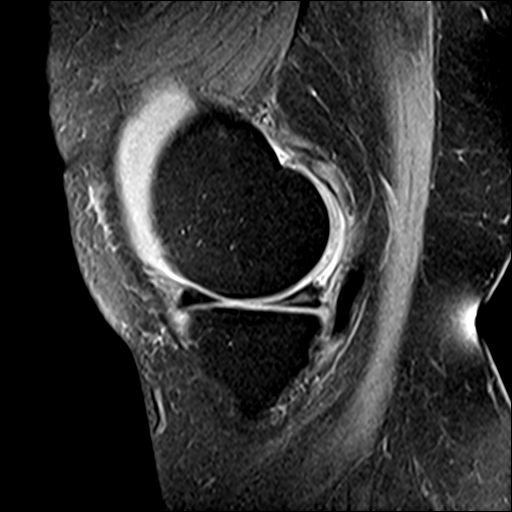
[im 12/27]
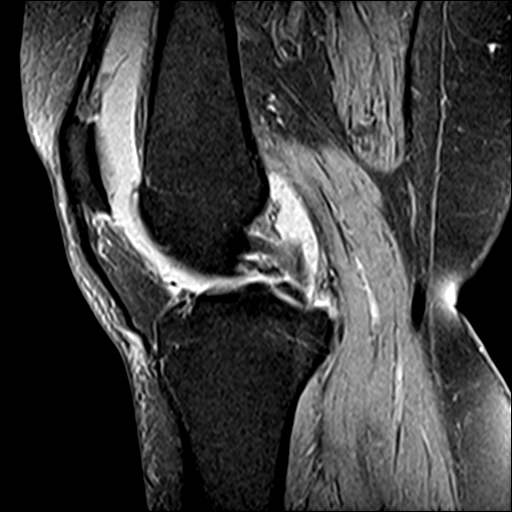
[im 15/27]
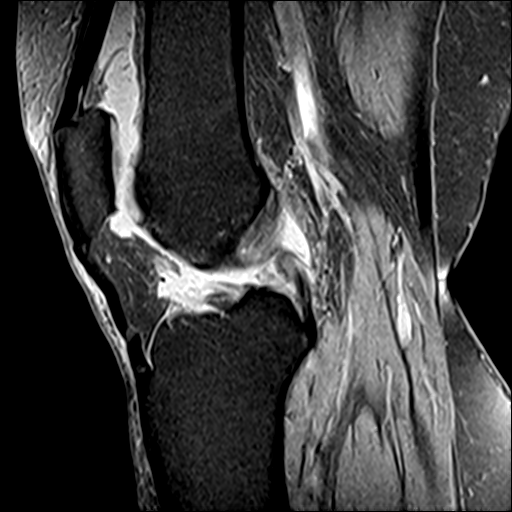
[im 19/27]
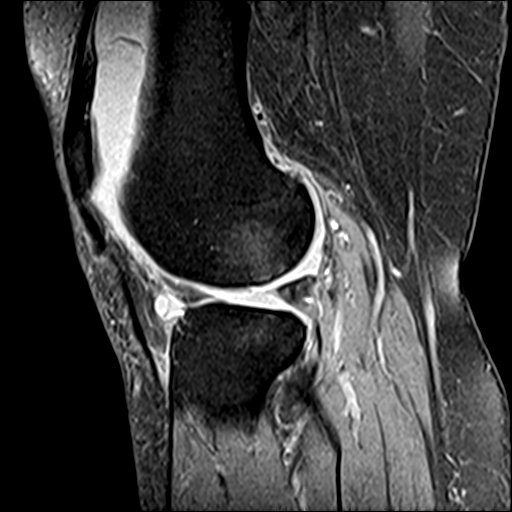
[im 23/27]
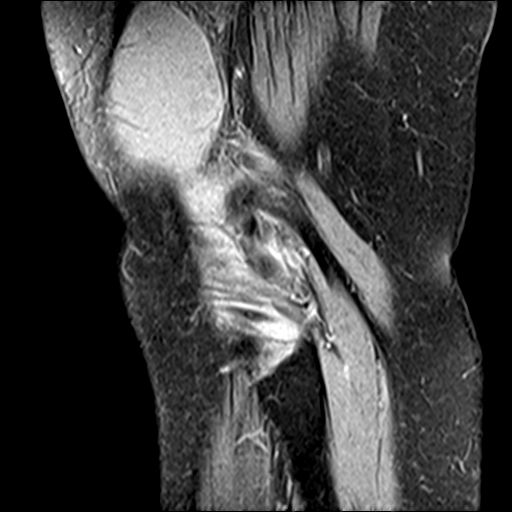
[im 27/27]
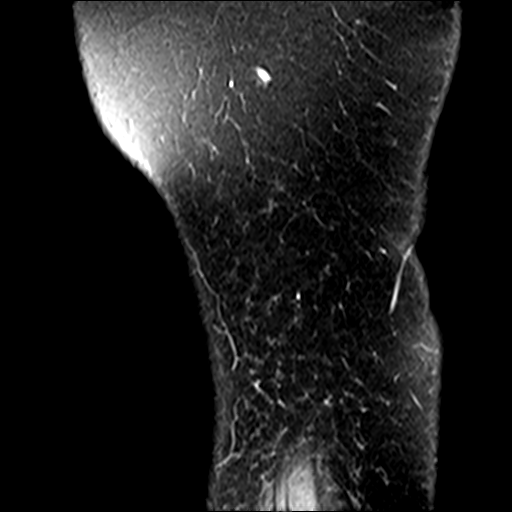

[Series 8: PD fat-sat · coronal · 3.0mm · 0.29mm/px · 6 of 28 slices shown (2 of 2)]
[im 1/28]
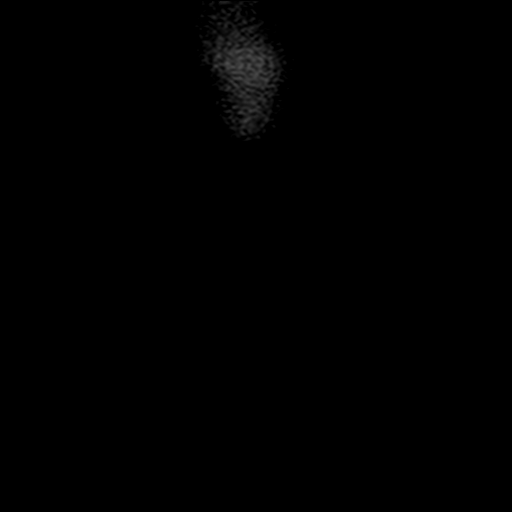
[im 4/28]
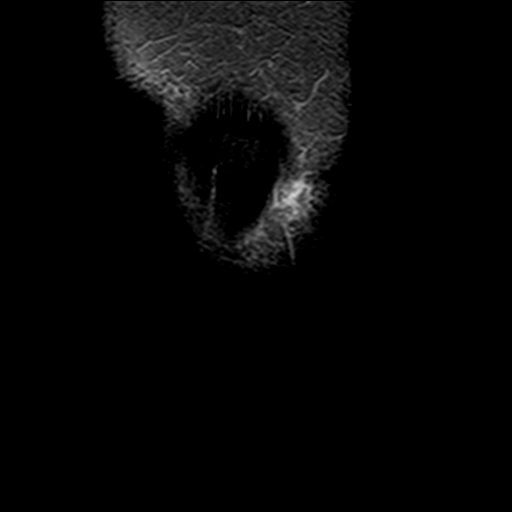
[im 8/28]
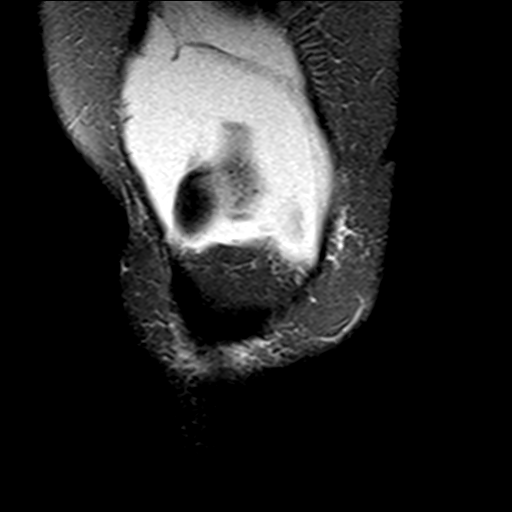
[im 12/28]
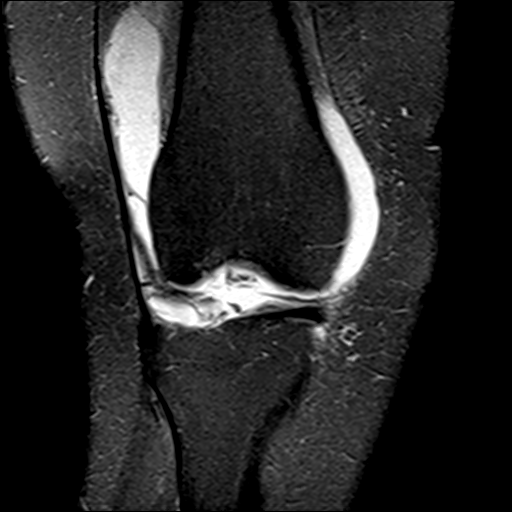
[im 16/28]
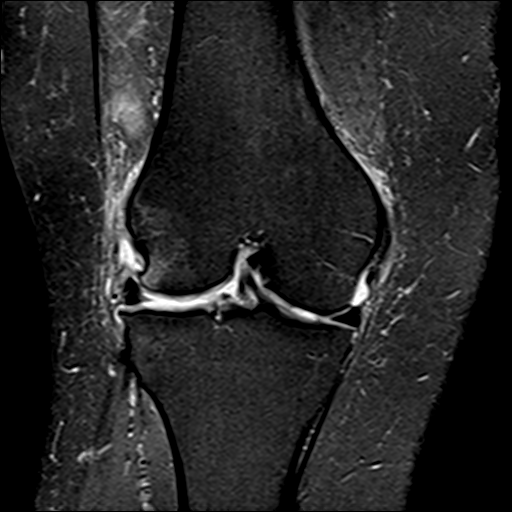
[im 24/28]
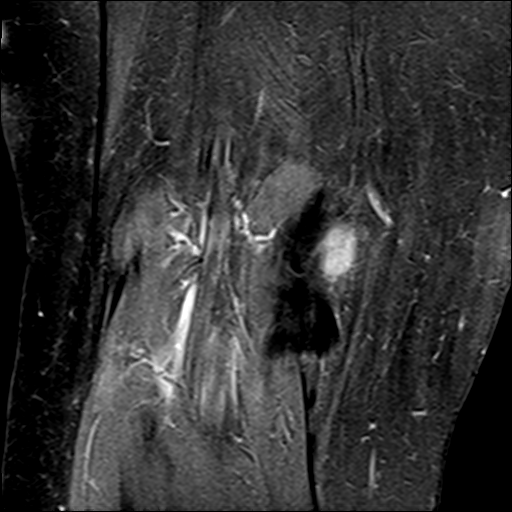

[20 of 40 positions shown; findings below may reference images not displayed]

FINDINGS: MENISCI

Medial meniscus:  Intrasubstance degeneration without tear.

Lateral meniscus: Degeneration and complex tearing of the lateral
meniscus most pronounced at the posterior horn.

LIGAMENTS

Cruciates: Intact ACL and PCL.

Collaterals: Intact MCL. Lateral collateral ligament complex intact.

CARTILAGE

Patellofemoral: Mild chondral thinning and surface irregularity,
most pronounced along the inferior aspect of the lateral patellar
facet.

Medial: Irregular full-thickness cartilage defect along the central
aspect of the medial femoral condyle measuring approximately 10 x 7
mm.

Lateral: Large full-thickness cartilage defect of the lateral
femoral condyle centrally and posteriorly measuring approximately
2.7 x 1.5 cm. High-grade cartilage loss throughout the lateral
tibial plateau.

MISCELLANEOUS

Joint: Large joint effusion containing loose bodies including 9 mm
linear fragment along the anterior margin of the lateral tibial
plateau. Fat pads within normal limits.

Popliteal Fossa:  No Baker's cyst. Intact popliteus tendon.

Extensor Mechanism:  Intact quadriceps and patellar tendons.

Bones: No acute fracture. No dislocation. Tricompartmental joint
space narrowing with marginal osteophyte formation. Reactive
subchondral marrow edema, most pronounced within the lateral
compartment. No marrow replacing bone lesion.

Other: No significant periarticular soft tissue findings.
IMPRESSION: 1. Tricompartmental osteoarthritis, most pronounced in the lateral
compartment where there is extensive full-thickness cartilage loss
2. Degeneration and complex tearing of the lateral meniscus.
3. Large joint effusion containing loose bodies.

## 2021-04-26 ENCOUNTER — Telehealth: Payer: Self-pay | Admitting: Pharmacy Technician

## 2021-04-26 NOTE — Telephone Encounter (Addendum)
Auth Submission: no auth needed Payer: [employer] Medication & CPT/J Code(s) submitted: Solumedrol (Methylprednisolone) J2930 Route of submission (phone, fax, portal): phone Auth type: Buy/Bill Units/visits requested: 1000 mg q4wks Reference number: [id number] Approval from: 04/26/21 to 12/31/22  Auth approval extended to 12/31/22 per MD notes to continue methylprednisolone 1000 mg IV q28 days.

## 2021-04-26 NOTE — Telephone Encounter (Signed)
Called patient and left a message per DPR that we will be sending her infusion orders to the [facility] on [address].  Dr. Posey Pronto has placed the orders and they will contact her to schedule and provide all details needed. Patient provided our contact information on her message if she had any questions or concerns.  ?

## 2021-04-30 NOTE — Progress Notes (Signed)
Right knee MRI shows the known osteoarthritis but there is also a tear of the lateral meniscus and there are some free bodies, which are usually pieces of cartilage that have torn or deteriorated. ? ?I think these findings probably explain her symptoms and she would benefit to see an orthopedic surgeon about options. If she has an office preference we can refer otherwise can refer to [facility].

## 2021-05-05 ENCOUNTER — Ambulatory Visit: Payer: Medicare PPO | Admitting: Neurology

## 2021-05-21 ENCOUNTER — Ambulatory Visit (INDEPENDENT_AMBULATORY_CARE_PROVIDER_SITE_OTHER): Payer: Medicare PPO

## 2021-05-21 VITALS — BP 120/79 | HR 74 | Temp 98.3°F | Resp 18 | Ht 64.0 in | Wt 195.8 lb

## 2021-05-21 DIAGNOSIS — G619 Inflammatory polyneuropathy, unspecified: Secondary | ICD-10-CM

## 2021-05-21 MED ORDER — SODIUM CHLORIDE 0.9 % IV SOLN
1000.0000 mg | Freq: Once | INTRAVENOUS | Status: AC
  Administered 2021-05-21: 1000 mg via INTRAVENOUS
  Filled 2021-05-21: qty 16

## 2021-05-21 NOTE — Progress Notes (Signed)
Diagnosis: Inflammatory Neuropathy ? ?Provider:  Marshell Garfinkel, MD ? ?Procedure: Infusion ? ?IV Type: Peripheral, IV Location: L Antecubital ? ?Solumedrol (Methylprednisolone),, Dose: 1000 mg ? ?Infusion Start Time: 1300 ? ?Infusion Stop Time: 1031 ? ?Post Infusion IV Care: Peripheral IV Discontinued ? ?Discharge: Condition: Good, Destination: Home . AVS provided to patient.  ? ?Performed by:  Terin Cragle, RN  ?  ?

## 2021-05-28 ENCOUNTER — Ambulatory Visit: Payer: Medicare PPO | Admitting: Neurology

## 2021-06-14 ENCOUNTER — Other Ambulatory Visit: Payer: Self-pay | Admitting: Neurology

## 2021-06-18 ENCOUNTER — Ambulatory Visit (INDEPENDENT_AMBULATORY_CARE_PROVIDER_SITE_OTHER): Payer: Medicare PPO

## 2021-06-18 VITALS — BP 118/80 | HR 70 | Temp 98.2°F | Resp 16 | Ht 62.0 in | Wt 190.2 lb

## 2021-06-18 DIAGNOSIS — G619 Inflammatory polyneuropathy, unspecified: Secondary | ICD-10-CM

## 2021-06-18 MED ORDER — SODIUM CHLORIDE 0.9 % IV SOLN
1000.0000 mg | Freq: Once | INTRAVENOUS | Status: AC
  Administered 2021-06-18: 1000 mg via INTRAVENOUS
  Filled 2021-06-18: qty 16

## 2021-06-18 NOTE — Progress Notes (Signed)
Diagnosis: Inflammatory Neuropathy  Provider:  Marshell Garfinkel, MD  Procedure: Infusion  IV Type: Peripheral, IV Location: L Antecubital  Actemra (Tocilizumab), Solumedrol (Methylprednisolone), Dose: 1000 mg  Infusion Start Time: 5072  Infusion Stop Time: 1400  Post Infusion IV Care: Peripheral IV Discontinued  Discharge: Condition: Good, Destination: Home . AVS provided to patient.   Performed by:  Cleophus Molt, RN

## 2021-07-16 ENCOUNTER — Ambulatory Visit (INDEPENDENT_AMBULATORY_CARE_PROVIDER_SITE_OTHER): Payer: Medicare PPO

## 2021-07-16 VITALS — BP 139/84 | HR 62 | Temp 98.4°F | Resp 18 | Ht 64.0 in | Wt 192.2 lb

## 2021-07-16 DIAGNOSIS — G619 Inflammatory polyneuropathy, unspecified: Secondary | ICD-10-CM | POA: Diagnosis not present

## 2021-07-16 MED ORDER — SODIUM CHLORIDE 0.9 % IV SOLN
1000.0000 mg | Freq: Once | INTRAVENOUS | Status: AC
  Administered 2021-07-16: 1000 mg via INTRAVENOUS
  Filled 2021-07-16: qty 16

## 2021-07-16 NOTE — Progress Notes (Signed)
Diagnosis: Inflammatory Neuropathy  Provider:  Marshell Garfinkel, MD  Procedure: Infusion  IV Type: Peripheral, IV Location: L Antecubital  Solumedrol (Methylprednisolone), Dose: 1000 mg  Infusion Start Time: 7530  Infusion Stop Time: 1040  Post Infusion IV Care: Peripheral IV Discontinued  Discharge: Condition: Good, Destination: Home . AVS provided to patient.   Performed by:  Adelina Mings, LPN

## 2021-08-13 ENCOUNTER — Ambulatory Visit (INDEPENDENT_AMBULATORY_CARE_PROVIDER_SITE_OTHER): Payer: Medicare PPO

## 2021-08-13 VITALS — BP 107/75 | HR 73 | Temp 98.1°F | Resp 18 | Ht 64.0 in | Wt 187.0 lb

## 2021-08-13 DIAGNOSIS — G619 Inflammatory polyneuropathy, unspecified: Secondary | ICD-10-CM

## 2021-08-13 MED ORDER — SODIUM CHLORIDE 0.9 % IV SOLN
1000.0000 mg | Freq: Once | INTRAVENOUS | Status: AC
  Administered 2021-08-13: 1000 mg via INTRAVENOUS
  Filled 2021-08-13: qty 16

## 2021-08-13 NOTE — Progress Notes (Signed)
Diagnosis: Inflammatory Neuropathy  Provider:  Marshell Garfinkel, MD  Procedure: Infusion  IV Type: Peripheral, IV Location: L Antecubital  Solumedrol (Methylprednisolone), Dose: 1000 mg  Infusion Start Time: 7949  Infusion Stop Time: 9718  Post Infusion IV Care: Peripheral IV Discontinued  Discharge: Condition: Good, Destination: Home . AVS provided to patient.   Performed by:  Cleophus Molt, RN

## 2021-09-10 ENCOUNTER — Ambulatory Visit: Payer: Medicare PPO | Admitting: Neurology

## 2021-09-10 ENCOUNTER — Encounter: Payer: Self-pay | Admitting: Neurology

## 2021-09-10 VITALS — BP 122/82 | HR 82 | Resp 18 | Ht 64.0 in | Wt 190.0 lb

## 2021-09-10 DIAGNOSIS — R29898 Other symptoms and signs involving the musculoskeletal system: Secondary | ICD-10-CM

## 2021-09-10 DIAGNOSIS — G619 Inflammatory polyneuropathy, unspecified: Secondary | ICD-10-CM | POA: Diagnosis not present

## 2021-09-10 NOTE — Progress Notes (Signed)
Follow-up Visit   Date: 09/10/21   TINNA KOLKER MRN: [id number] DOB: 1955-07-02   Interim History: Hailey Bates is a 66 y.o. 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  She was previously seen in April 2022 for left arm weakness.  The patient was accompanied to the clinic by self.  History of present illness: In September 2021, she woke up in the middle of the night with left proximal arm throbbing pain.  She was given steroid injection for possible muscle strain. Because of unrelenting pain, she was given 5-days of prednisone which helped her pain when taking the steroids, but then returned once she stopped it.  She was seen by orthopeadics who felt she may have high nerve entrapment. She completed physical therapy which did not help her symptoms.  By November 2021, she was having tingling and sharp pain over upper lateral arm, forearm, and dorsum of the thumb, index finger, and middle finger.  She has some improvement in pain with gabapentin 343m three times daily.  She has weakness in the left hand and arm. MRI cervical spine shows cervical spondylosis with neural foraminal stenosis at C5-6 bilaterally, which would not explain symptoms, so she was referred for EMG.  EDX from April 2022 showed sensorimotor polyneuropathy affecting both arms. Of note, since February, she feels that her pain and weakness has stabilized and there has been no further progression.   Her brother and mother had burning pain in the feet, neither are diabetic.  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  [place]  No history of diabetes or heavy alcohol use.   UPDATE 11/25/2020: She is accompanied by her cousin, JArville Go At her last visit in April, I recommended CSF testing,  however, patient tells me that her left arm weakness started to improve, so she decided not to proceed with testing.  She completed OT and had complete return of left arm strength.  She was doing well until 09/25/2020, when she slipped off her bed and hit her right arm against the nightstand. About two days later, she began having excruciating pain, described as achy/throbbing involving the upper arm and forearm.  No associated tingling or numbness or weakness at onset.  About three weeks later, she began having weakness in the right hand, where she was unable to open or make a fist on the right hand.  She has tried ibuprofen and tylenol which helps her pain.  She continues to have ongoing right hand weakness and entire arm pain.  She is unable to use the hand.  She was referred for EMG by Dr. GVanetta Shawloffice which shows asymmetric sensorimotor polyneuropathy, with axonal and demyelinating features, affecting the upper extremities (worse on the right), which has progressed compared to study on 04/29/2020.  She is here for further evaluation.  UPDATE 12/30/2020:  She is here for follow-up visit to discuss the results of her CSF testing.  CSF protein and IgG index was normal, however there were 4 oligoclonal bands in the CSF which was not present in the serum.  Clinically, there has been no significant improvement or worsening of right hand weakness, she is still not able to use the hand at all.  Over the past 1-2 weeks, she has new right knee swelling and pain, especially if  she stands on it for prolonged period of time.  She continues to have mild swelling of the right hand and fingers. Since increasing gabapentin to 649m TID, her pain has significantly improved. MRI brain was normal.  UPDATE 02/16/2021:  She is here for follow-up visit. She was given a trial of solumedrol 1g x5 days in early December.  She feels much better after taking steroids, especially with more energy and improved mood.  She has noticed  trace improvement in hand weakness.  She is able to flex the fingers slightly more.  The hand is still essentially nonfunctional. No worsening symptoms or new neurological complaints.   UPDATE 09/10/2021:  She has noticed some improvement in the right hand with pain and strength Her pain is improved and now she takes gabapentin 6078mBID.  She is able to hold items in the right hand better, such as holding a hair dryer.  She still has no movement with trying to extend the fingers or wrist, but flexion is better.  She has been tolerating Solumedrol infusions, and asks if she should get bone density evaluation.   Medications:  Current Outpatient Medications on File Prior to Visit  Medication Sig Dispense Refill   amLODipine-olmesartan (AZOR) 5-20 MG tablet Take 1 tablet by mouth daily.     celecoxib (CELEBREX) 100 MG capsule Take 100 mg by mouth daily.     cyclobenzaprine (FLEXERIL) 10 MG tablet Take 10 mg by mouth daily as needed. Takes for muscle spasms     gabapentin (NEURONTIN) 300 MG capsule TAKE TWO CAPSULES THREE TIMES DAILY (MAY TAKE 1 EXTRA IF NEEDED) 210 capsule 1   Ibuprofen 200 MG CAPS Take by mouth as needed.     oxyCODONE-acetaminophen (PERCOCET) 10-325 MG tablet Take by mouth.     zolpidem (AMBIEN) 10 MG tablet Take 10 mg by mouth at bedtime.     HYDROcodone-acetaminophen (NORCO) 5-325 MG per tablet Take 1 tablet by mouth every 6 (six) hours as needed. Takes for pain (Patient not taking: Reported on 09/10/2021)     No current facility-administered medications on file prior to visit.    Allergies: No Known Allergies  Vital Signs:  BP 122/82   Pulse 82   Resp 18   Ht 5' 4"  (1.626 m)   Wt 190 lb (86.2 kg)   SpO2 96%   BMI 32.61 kg/m   Neurological Exam: MENTAL STATUS including orientation to time, place, person, recent and remote memory, attention span and concentration, language, and fund of knowledge is normal.  Speech is not dysarthric.  CRANIAL NERVES:   Normal  conjugate, extra-ocular eye movements in all directions of gaze.  No ptosis.     MOTOR:  Moderate right hand and forearm atrophy. No fasciculations or abnormal movements.  No pronator drift.   Upper Extremity:  Right  Left  Deltoid  5/5   5/5   Biceps  5/5   5/5   Triceps * 5/5   5/5   Wrist extensors  1/5   5/5   Wrist flexors * 5-/5   5/5   Finger extensors  1/5   5/5   Finger flexors * 4+/5   5/5   Dorsal interossei  1/5   5/5   Abductor pollicis  1/5   5/5   Tone (Ashworth scale)  0  0   Lower Extremity:  Right  Left  Hip flexors  5/5   5/5   Hip extensors  5/5   5/5   Adductor  5/5  5/5  Abductor 5/5  5/5  Knee flexors  5/5   5/5   Knee extensors  5/5   5/5   Dorsiflexors  5/5   5/5   Plantarflexors  5/5   5/5   Toe extensors  5/5   5/5   Toe flexors  5/5   5/5   Tone (Ashworth scale)  0  0    MSRs:                                           Right        Left brachioradialis 2+  2+  biceps 2+  2+  triceps 2+  2+  patellar 2+  2+  ankle jerk 2+  2+  plantar response down  down   SENSORY: Temperature reduced over the ulnar aspect of the hand and arm, sensation intact in the forearm. Left hand and bilateral leg sensation is normal.   COORDINATION/GAIT:  Normal finger-to- nose-finger.  She is able to approximate the right thumb to the index finger.  Normal finger tapping on the left.  Gait narrow based and stable.    Data: NCS/EMG of the upper extremities 11/12/2020: This is a complex study.  The electrophysiologic findings are most suggestive of an asymmetric sensorimotor polyneuropathy, with axonal and demyelinating features, affecting the upper extremities (worse on the right), which has progressed compared to study on 04/29/2020.  Recommend further evaluation for polyradiculoneuropathy.  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  CSF testing shows 4 oligoclonal bands in the CSF, not present in the serum, indicative of intrathecal protein synthesis as seen with inflammation/immune-mediated process.  There are no signs of CSF infection.  CSF protein was normal, which argues against CIDP.  - She has been tolerating Solumedrol 1g monthly infusion and there has been some improvement especially with wrist and finger flexion, so I will plan to continue monthly infusions  2. History of left parsonage turner syndrome (2022)  PLAN/RECOMMENDATIONS:  Continue solumedrol 1g every 28 days x 6 months Continue gabapentin 690m twice daily Recommend follow-up with PCP for osteoporosis screening and management  Return to clinic in 6 months  Thank you for allowing me to participate in patient's care.  If I can answer any additional questions, I would be pleased to do so.    Sincerely,    Kyleah Pensabene K. PPosey Pronto DO

## 2021-09-10 NOTE — Patient Instructions (Addendum)
We will continue steroid infusion once per month  Please follow-up with your primary care doctor about checking for osteoporosis  Continue gabapentin '600mg'$  twice daily  Return to clinic in 6 months

## 2021-09-13 ENCOUNTER — Ambulatory Visit (INDEPENDENT_AMBULATORY_CARE_PROVIDER_SITE_OTHER): Payer: Medicare PPO

## 2021-09-13 VITALS — BP 113/74 | HR 68 | Temp 97.9°F | Resp 18 | Ht 64.0 in | Wt 188.8 lb

## 2021-09-13 DIAGNOSIS — G619 Inflammatory polyneuropathy, unspecified: Secondary | ICD-10-CM | POA: Diagnosis not present

## 2021-09-13 MED ORDER — SODIUM CHLORIDE 0.9 % IV SOLN
1000.0000 mg | Freq: Once | INTRAVENOUS | Status: AC
  Administered 2021-09-13: 1000 mg via INTRAVENOUS
  Filled 2021-09-13: qty 16

## 2021-09-13 NOTE — Progress Notes (Signed)
Diagnosis: Inflammatory Neuropathy  Provider:  Marshell Garfinkel MD  Procedure: Infusion  IV Type: Peripheral, IV Location: R Antecubital  Solumedrol (Methylprednisolone), Dose: 1000 mg  Infusion Start Time: 1586  Infusion Stop Time: 1409  Post Infusion IV Care: Peripheral IV Discontinued  Discharge: Condition: Good, Destination: Home . AVS provided to patient.   Performed by:  Cleophus Molt, RN

## 2021-09-22 ENCOUNTER — Other Ambulatory Visit: Payer: Self-pay | Admitting: Pharmacy Technician

## 2021-10-11 ENCOUNTER — Other Ambulatory Visit: Payer: Self-pay | Admitting: Neurology

## 2021-10-11 ENCOUNTER — Ambulatory Visit: Payer: Medicare PPO

## 2021-10-11 NOTE — H&P (Signed)
TOTAL KNEE ADMISSION H&P  Patient is being admitted for right total knee arthroplasty.  Subjective:  Chief Complaint: Right knee pain.  HPI: Hailey Bates, 66 y.o. female has a history of pain and functional disability in the right knee due to arthritis and has failed non-surgical conservative treatments for greater than 12 weeks to include NSAID's and/or analgesics, viscosupplementation injections, and activity modification. Onset of symptoms was gradual, starting  several  years ago with gradually worsening course since that time. The patient noted no past surgery on the right knee.  Patient currently rates pain in the right knee at 8 out of 10 with activity. Patient has night pain, worsening of pain with activity and weight bearing, pain with passive range of motion, and crepitus. Patient has evidence of  significant lateral joint space narrowing with subchondral sclerosis on the lateral femoral condyle. The patellofemoral joint is mildly narrow. The medial compartment looks normal  by imaging studies. There is no active infection.  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   Pain in right hand 09/14/2020   Pain in left arm 08/24/2020    Past Medical History:  Diagnosis Date   Hypertension    Neuromuscular disorder [facility])    sciatica    Past Surgical History:  Procedure Laterality Date   ANTERIOR AND POSTERIOR REPAIR  01/04/2011   Procedure: ANTERIOR (CYSTOCELE) AND POSTERIOR REPAIR (RECTOCELE);  Surgeon: Reece Packer, MD;  Location: [facility];  Service: Urology;   Laterality: N/A;  anterior repair/cysto, with graft,no posterior repair   BLADDER SUSPENSION  01/04/2011   Procedure: [place];  Surgeon: Reece Packer, MD;  Location: [facility];  Service: Urology;  Laterality: N/A;   CARDIAC CATHETERIZATION     CYSTOSCOPY  01/04/2011   Procedure: CYSTOSCOPY;  Surgeon: Reece Packer, MD;  Location: [facility];  Service: Urology;  Laterality: N/A;   NASAL SINUS SURGERY     VAGINAL HYSTERECTOMY  01/04/2011   Procedure: HYSTERECTOMY VAGINAL;  Surgeon: Lubertha South Romine;  Location: [facility];  Service: Gynecology;  Laterality: N/A;   VAGINAL PROLAPSE REPAIR  01/04/2011   Procedure: VAGINAL VAULT SUSPENSION;  Surgeon: Reece Packer, MD;  Location: [facility];  Service: Urology;  Laterality: N/A;    Prior to Admission medications   Medication Sig Start Date End Date Taking? Authorizing Provider  amLODipine-olmesartan (AZOR) 5-20 MG tablet Take 1 tablet by mouth daily.    [provider]  celecoxib (CELEBREX) 100 MG capsule Take 100 mg by mouth daily.    [provider]  cyclobenzaprine (FLEXERIL) 10 MG tablet Take 10 mg by mouth daily as needed. Takes for muscle spasms    [provider]  gabapentin (NEURONTIN) 300 MG capsule TAKE TWO CAPSULES THREE TIMES DAILY (MAY TAKE 1 EXTRA IF NEEDED) 06/15/21   Narda Amber K, DO  HYDROcodone-acetaminophen (NORCO) 5-325 MG per tablet Take 1 tablet by mouth every 6 (six) hours as needed. Takes for pain Patient not taking: Reported on 09/10/2021    [provider]  Ibuprofen 200 MG CAPS Take by mouth as needed.  [provider]  oxyCODONE-acetaminophen (PERCOCET) 10-325 MG tablet Take by mouth. 03/29/21   [provider]  zolpidem (AMBIEN) 10 MG tablet Take 10 mg by mouth at bedtime.    [provider]    No Known Allergies  Social History   Socioeconomic History   Marital status: Divorced    Spouse name: Not on file   Number of children: 1   Years of  education: Not on file   Highest education level: Not on file  Occupational History   Not on file  Tobacco Use   Smoking status: Never   Smokeless tobacco: Never  Vaping Use   Vaping Use: Never used  Substance and Sexual Activity   Alcohol use: Not Currently   Drug use: No   Sexual activity: Not on file  Other Topics Concern   Not on file  Social History Narrative   Right Handed    Lives in a one story home   Drinks Caffeine    Social Determinants of Health   Financial Resource Strain: Not on file  Food Insecurity: Not on file  Transportation Needs: Not on file  Physical Activity: Not on file  Stress: Not on file  Social Connections: Not on file  Intimate Partner Violence: Not on file    Tobacco Use: Low Risk  (09/10/2021)   Patient History    Smoking Tobacco Use: Never    Smokeless Tobacco Use: Never    Passive Exposure: Not on file   Social History   Substance and Sexual Activity  Alcohol Use Not Currently    Family History  Problem Relation Age of Onset   Hypertension Mother    Diabetes Father    Hypertension Father    Heart disease Brother    Arthritis/Rheumatoid Brother    Healthy Daughter     Review of Systems  Constitutional:  Negative for chills and fever.  HENT:  Negative for congestion, sore throat and tinnitus.   Eyes:  Negative for double vision, photophobia and pain.  Respiratory:  Negative for cough, shortness of breath and wheezing.   Cardiovascular:  Negative for chest pain, palpitations and orthopnea.  Gastrointestinal:  Negative for heartburn, nausea and vomiting.  Genitourinary:  Negative for dysuria, frequency and urgency.  Musculoskeletal:  Positive for joint pain.  Neurological:  Negative for dizziness, weakness and headaches.    Objective:  Physical Exam: Well nourished and well developed.  General: Alert and oriented x3, cooperative and pleasant, no acute distress.  Head: normocephalic, atraumatic, neck supple.  Eyes: EOMI.   Musculoskeletal:  Right Knee Exam: Moderate effusion present. No swelling present. The range of motion is: 0 to 125 degrees. No crepitus on range of motion of the knee. No medial joint line tenderness. Positive lateral joint line tenderness. The knee is stable.  Calves soft and nontender. Motor function intact in LE. Strength 5/5 LE bilaterally. Neuro: Distal pulses 2+. Sensation to light touch intact in LE.  Imaging Review Plain radiographs demonstrate severe degenerative joint disease of the right knee. The overall alignment is neutral. The bone quality appears to be adequate for age and reported activity level.  Assessment/Plan:  End stage arthritis, right knee   The patient history, physical examination, clinical judgment of the provider and imaging studies are consistent with end stage degenerative joint disease of the right knee and total knee arthroplasty is deemed medically necessary. The treatment options including medical management, injection therapy arthroscopy and arthroplasty were discussed at length. The risks and benefits  of total knee arthroplasty were presented and reviewed. The risks due to aseptic loosening, infection, stiffness, patella tracking problems, thromboembolic complications and other imponderables were discussed. The patient acknowledged the explanation, agreed to proceed with the plan and consent was signed. Patient is being admitted for inpatient treatment for surgery, pain control, PT, OT, prophylactic antibiotics, VTE prophylaxis, progressive ambulation and ADLs and discharge planning. The patient is planning to be discharged  home .   Patient's anticipated LOS is less than 2 midnights, meeting these requirements: - Younger than 28 - Lives within 1 hour of care - Has a competent adult at home to recover with post-op recover - NO history of  - Chronic pain requiring opiods  - Diabetes  - Coronary Artery Disease  - Heart failure  - Heart attack  -  Stroke  - DVT/VTE  - Cardiac arrhythmia  - Respiratory Failure/COPD  - Renal failure  - Anemia  - Advanced Liver disease  Therapy Plans: Outpatient therapy at [facility] Disposition: Home with daughter and cousin Planned DVT Prophylaxis: Aspirin 325 mg BID DME Needed: None PCP: Sharlyne Cai, FNP (saw for clearance in July) TXA: IV Allergies: NKDA Anesthesia Concerns: None BMI: 33.7 Last HgbA1c: Not diabetic.  Pharmacy: [facility]  Other: - Percocet 10-325 mg BID. Discussed dilaudid postoperatively. - Solumedrol infusion once monthly (next is 9/12)  - Patient was instructed on what medications to stop prior to surgery. - Follow-up visit in 2 weeks with Dr. Wynelle Link - Begin physical therapy following surgery - Pre-operative lab work as pre-surgical testing - Prescriptions will be provided in hospital at time of discharge  Theresa Duty, PA-C Orthopedic Surgery [facility] [place]

## 2021-10-12 ENCOUNTER — Other Ambulatory Visit: Payer: Self-pay

## 2021-10-12 ENCOUNTER — Encounter (HOSPITAL_COMMUNITY)
Admission: RE | Admit: 2021-10-12 | Discharge: 2021-10-12 | Disposition: A | Discharge status: Home / Self Care | Payer: Medicare PPO | Source: Ambulatory Visit | Attending: Neurology | Admitting: Neurology

## 2021-10-12 DIAGNOSIS — G619 Inflammatory polyneuropathy, unspecified: Secondary | ICD-10-CM | POA: Insufficient documentation

## 2021-10-12 MED ORDER — SODIUM CHLORIDE 0.9 % IV SOLN
1000.0000 mg | Freq: Once | INTRAVENOUS | Status: AC
  Administered 2021-10-12: 1000 mg via INTRAVENOUS
  Filled 2021-10-12: qty 16

## 2021-10-12 NOTE — Progress Notes (Signed)
Diagnosis: Inflammatory Neuropathy  Provider:  Narda Amber DO  Procedure: Infusion  IV Type: Peripheral, IV Location: L Antecubital  Solumedrol (Methylprednisolone), Dose: 1000 mg  Infusion Start Time: 1595  Infusion Stop Time: 3967  Post Infusion IV Care: Patient declined observation and Peripheral IV Discontinued  Discharge: Condition: Good, Destination: Home . AVS provided to patient.   Performed by:  Jonelle Sidle, RN

## 2021-10-18 NOTE — Progress Notes (Addendum)
Anesthesia Review:  PCP: LOV 08/17/21 Hailey Bates on chart - for preop clearance  Cardiologist :none  Chest x-ray : EKG :10/19/21  Echo : Stress test: Cardiac Cath :  Activity level: can do a flight of stairs without difficulty  Sleep Study/ CPAP : none  Fasting Blood Sugar :      / Checks Blood Sugar -- times a day:   Blood Thinner/ Instructions /Last Dose: ASA / Instructions/ Last Dose :   All organs per pt are located on the opposige side in her body. .  - Situs Inversus - Hailey Bates, PAC made aware at time of preop appt.  Also placed in medical hx and special needs column on or schedule.   Pt has an inflammatory neuropathy that has affected her right hand.  She can no longer write with her hand

## 2021-10-18 NOTE — Patient Instructions (Signed)
SURGICAL WAITING ROOM VISITATION Patients having surgery or a procedure may have no more than 2 support people in the waiting area - these visitors may rotate.   Children under the age of 3 must have an adult with them who is not the patient. If the patient needs to stay at the hospital during part of their recovery, the visitor guidelines for inpatient rooms apply. Pre-op nurse will coordinate an appropriate time for 1 support person to accompany patient in pre-op.  This support person may not rotate.    Please refer to the [facility] website for the visitor guidelines for Inpatients (after your surgery is over and you are in a regular room).       Your procedure is scheduled on:  11/01/2021    Report to [facility] Main Entrance    Report to admitting at  1010 AM   Call this number if you have problems the morning of surgery [phone]   Do not eat food :After Midnight.   After Midnight you may have the following liquids until __ 0940____ AM DAY OF SURGERY  Water Non-Citrus Juices (without pulp, NO RED) Carbonated Beverages Black Coffee (NO MILK/CREAM OR CREAMERS, sugar ok)  Clear Tea (NO MILK/CREAM OR CREAMERS, sugar ok) regular and decaf                             Plain Jell-O (NO RED)                                           Fruit ices (not with fruit pulp, NO RED)                                     Popsicles (NO RED)                                                               Sports drinks like Gatorade (NO RED)                    The day of surgery:  Drink ONE (1) Pre-Surgery Clear Ensure or G2 at 0940 AM ( have completed by )  the morning of surgery. Drink in one sitting. Do not sip.  This drink was given to you during your hospital  pre-op appointment visit. Nothing else to drink after completing the  Pre-Surgery Clear Ensure or G2.          If you have questions, please contact your surgeon's office.       Oral Hygiene is also important to  reduce your risk of infection.                                    Remember - BRUSH YOUR TEETH THE MORNING OF SURGERY WITH YOUR REGULAR TOOTHPASTE   Do NOT smoke after Midnight   Take these medicines the morning of surgery with A SIP OF WATER:  pepcid, gabapentin   DO NOT TAKE  ANY ORAL DIABETIC MEDICATIONS DAY OF YOUR SURGERY  Bring CPAP mask and tubing day of surgery.                              You may not have any metal on your body including hair pins, jewelry, and body piercing             Do not wear make-up, lotions, powders, perfumes/cologne, or deodorant  Do not wear nail polish including gel and S&S, artificial/acrylic nails, or any other type of covering on natural nails including finger and toenails. If you have artificial nails, gel coating, etc. that needs to be removed by a nail salon please have this removed prior to surgery or surgery may need to be canceled/ delayed if the surgeon/ anesthesia feels like they are unable to be safely monitored.   Do not shave  48 hours prior to surgery.               Men may shave face and neck.   Do not bring valuables to the hospital. [place].   Contacts, dentures or bridgework may not be worn into surgery.   Bring small overnight bag day of surgery.   DO NOT [place]. PHARMACY WILL DISPENSE MEDICATIONS LISTED ON YOUR MEDICATION LIST TO YOU DURING YOUR ADMISSION [place]!    Patients discharged on the day of surgery will not be allowed to drive home.  Someone NEEDS to stay with you for the first 24 hours after anesthesia.   Special Instructions: Bring a copy of your healthcare power of attorney and living will documents the day of surgery if you haven't scanned them before.              Please read over the following fact sheets you were given: IF [place] [phone]   If you  received a COVID test during your pre-op visit  it is requested that you wear a mask when out in public, stay away from anyone that may not be feeling well and notify your surgeon if you develop symptoms. If you test positive for Covid or have been in contact with anyone that has tested positive in the last 10 days please notify you surgeon.     [place] - Preparing for Surgery Before surgery, you can play an important role.  Because skin is not sterile, your skin needs to be as free of germs as possible.  You can reduce the number of germs on your skin by washing with CHG (chlorahexidine gluconate) soap before surgery.  CHG is an antiseptic cleaner which kills germs and bonds with the skin to continue killing germs even after washing. Please DO NOT use if you have an allergy to CHG or antibacterial soaps.  If your skin becomes reddened/irritated stop using the CHG and inform your nurse when you arrive at Short Stay. Do not shave (including legs and underarms) for at least 48 hours prior to the first CHG shower.  You may shave your face/neck. Please follow these instructions carefully:  1.  Shower with CHG Soap the night before surgery and the  morning of Surgery.  2.  If you choose to wash your hair, wash your hair first as usual with your  normal  shampoo.  3.  After you shampoo, rinse your hair and body thoroughly to remove the  shampoo.                           4.  Use CHG as you would any other liquid soap.  You can apply chg directly  to the skin and wash                       Gently with a scrungie or clean washcloth.  5.  Apply the CHG Soap to your body ONLY FROM THE NECK DOWN.   Do not use on face/ open                           Wound or open sores. Avoid contact with eyes, ears mouth and genitals (private parts).                       Wash face,  Genitals (private parts) with your normal soap.             6.  Wash thoroughly, paying special attention to the area where your surgery  will  be performed.  7.  Thoroughly rinse your body with warm water from the neck down.  8.  DO NOT shower/wash with your normal soap after using and rinsing off  the CHG Soap.                9.  Pat yourself dry with a clean towel.            10.  Wear clean pajamas.            11.  Place clean sheets on your bed the night of your first shower and do not  sleep with pets. Day of Surgery : Do not apply any lotions/deodorants the morning of surgery.  Please wear clean clothes to the hospital/surgery center.  FAILURE TO FOLLOW THESE INSTRUCTIONS MAY RESULT IN THE CANCELLATION OF YOUR SURGERY PATIENT SIGNATURE_________________________________  NURSE SIGNATURE__________________________________  ________________________________________________________________________

## 2021-10-19 ENCOUNTER — Encounter (HOSPITAL_COMMUNITY)
Admission: RE | Admit: 2021-10-19 | Discharge: 2021-10-19 | Disposition: A | Discharge status: Home / Self Care | Payer: Medicare PPO | Source: Ambulatory Visit | Attending: Orthopedic Surgery | Admitting: Orthopedic Surgery

## 2021-10-19 ENCOUNTER — Encounter (HOSPITAL_COMMUNITY): Payer: Self-pay

## 2021-10-19 ENCOUNTER — Other Ambulatory Visit: Payer: Self-pay

## 2021-10-19 VITALS — BP 129/94 | HR 94 | Temp 97.6°F | Resp 16 | Ht 64.0 in | Wt 186.0 lb

## 2021-10-19 DIAGNOSIS — Z01818 Encounter for other preprocedural examination: Secondary | ICD-10-CM | POA: Diagnosis present

## 2021-10-19 HISTORY — DX: Anxiety disorder, unspecified: F41.9

## 2021-10-19 HISTORY — DX: Situs inversus: Q89.3

## 2021-10-19 HISTORY — DX: Gastro-esophageal reflux disease without esophagitis: K21.9

## 2021-10-19 HISTORY — DX: Unspecified osteoarthritis, unspecified site: M19.90

## 2021-10-19 LAB — CBC
HCT: 46.7 % — ABNORMAL HIGH (ref 36.0–46.0)
Hemoglobin: 14.4 g/dL (ref 12.0–15.0)
MCH: 27.9 pg (ref 26.0–34.0)
MCHC: 30.8 g/dL (ref 30.0–36.0)
MCV: 90.5 fL (ref 80.0–100.0)
Platelets: 284 10*3/uL (ref 150–400)
RBC: 5.16 MIL/uL — ABNORMAL HIGH (ref 3.87–5.11)
RDW: 13.7 % (ref 11.5–15.5)
WBC: 9.7 10*3/uL (ref 4.0–10.5)
nRBC: 0 % (ref 0.0–0.2)

## 2021-10-19 LAB — SURGICAL PCR SCREEN
MRSA, PCR: NEGATIVE
Staphylococcus aureus: NEGATIVE

## 2021-10-19 LAB — BASIC METABOLIC PANEL
Anion gap: 8 (ref 5–15)
BUN: 20 mg/dL (ref 8–23)
CO2: 24 mmol/L (ref 22–32)
Calcium: 9.9 mg/dL (ref 8.9–10.3)
Chloride: 106 mmol/L (ref 98–111)
Creatinine, Ser: 0.97 mg/dL (ref 0.44–1.00)
GFR, Estimated: >60 mL/min (ref >60)
Glucose, Bld: 98 mg/dL (ref 70–99)
Potassium: 4.2 mmol/L (ref 3.5–5.1)
Sodium: 138 mmol/L (ref 135–145)

## 2021-11-01 ENCOUNTER — Encounter (HOSPITAL_COMMUNITY)
Admission: RE | Disposition: A | Discharge status: Home / Self Care | Payer: Self-pay | Source: Ambulatory Visit | Attending: Orthopedic Surgery

## 2021-11-01 ENCOUNTER — Encounter (HOSPITAL_COMMUNITY): Payer: Self-pay | Admitting: Orthopedic Surgery

## 2021-11-01 ENCOUNTER — Observation Stay (HOSPITAL_COMMUNITY)
Admission: RE | Admit: 2021-11-01 | Discharge: 2021-11-02 | Disposition: A | Discharge status: Home / Self Care | Payer: Medicare PPO | Source: Ambulatory Visit | Attending: Orthopedic Surgery | Admitting: Orthopedic Surgery

## 2021-11-01 ENCOUNTER — Other Ambulatory Visit: Payer: Self-pay

## 2021-11-01 ENCOUNTER — Ambulatory Visit (HOSPITAL_COMMUNITY): Payer: Medicare PPO | Admitting: Physician Assistant

## 2021-11-01 ENCOUNTER — Ambulatory Visit (HOSPITAL_BASED_OUTPATIENT_CLINIC_OR_DEPARTMENT_OTHER): Payer: Medicare PPO | Admitting: Anesthesiology

## 2021-11-01 DIAGNOSIS — F419 Anxiety disorder, unspecified: Secondary | ICD-10-CM

## 2021-11-01 DIAGNOSIS — I10 Essential (primary) hypertension: Secondary | ICD-10-CM

## 2021-11-01 DIAGNOSIS — M1711 Unilateral primary osteoarthritis, right knee: Secondary | ICD-10-CM | POA: Diagnosis not present

## 2021-11-01 DIAGNOSIS — Z01818 Encounter for other preprocedural examination: Secondary | ICD-10-CM

## 2021-11-01 DIAGNOSIS — Z79899 Other long term (current) drug therapy: Secondary | ICD-10-CM | POA: Insufficient documentation

## 2021-11-01 DIAGNOSIS — M179 Osteoarthritis of knee, unspecified: Secondary | ICD-10-CM | POA: Diagnosis present

## 2021-11-01 HISTORY — PX: TOTAL KNEE ARTHROPLASTY: SHX125

## 2021-11-01 SURGERY — ARTHROPLASTY, KNEE, TOTAL
Anesthesia: Monitor Anesthesia Care | Site: Knee | Laterality: Right

## 2021-11-01 MED ORDER — BISACODYL 10 MG RE SUPP: 10.0000 mg | Freq: Every day | RECTAL | Status: DC | PRN

## 2021-11-01 MED ORDER — DOCUSATE SODIUM 100 MG PO CAPS
100.0000 mg | ORAL_CAPSULE | Freq: Two times a day (BID) | ORAL | Status: DC
  Administered 2021-11-01 – 2021-11-02 (×2): 100 mg via ORAL
  Filled 2021-11-01 (×2): qty 1

## 2021-11-01 MED ORDER — MORPHINE SULFATE (PF) 2 MG/ML IV SOLN: 1.0000 mg | INTRAVENOUS | Status: DC | PRN

## 2021-11-01 MED ORDER — METOCLOPRAMIDE HCL 5 MG PO TABS
5.0000 mg | ORAL_TABLET | Freq: Three times a day (TID) | ORAL | Status: DC | PRN
  Filled 2021-11-01: qty 2

## 2021-11-01 MED ORDER — ACETAMINOPHEN 325 MG PO TABS: 325.0000 mg | ORAL_TABLET | ORAL | Status: DC | PRN

## 2021-11-01 MED ORDER — TRANEXAMIC ACID-NACL 1000-0.7 MG/100ML-% IV SOLN
1000.0000 mg | INTRAVENOUS | Status: AC
  Administered 2021-11-01: 1000 mg via INTRAVENOUS
  Filled 2021-11-01: qty 100

## 2021-11-01 MED ORDER — MENTHOL 3 MG MT LOZG: 1.0000 | LOZENGE | OROMUCOSAL | Status: DC | PRN

## 2021-11-01 MED ORDER — BUPIVACAINE LIPOSOME 1.3 % IJ SUSP
INTRAMUSCULAR | Status: AC
  Filled 2021-11-01: qty 20

## 2021-11-01 MED ORDER — ONDANSETRON HCL 4 MG/2ML IJ SOLN: 4.0000 mg | Freq: Once | INTRAMUSCULAR | Status: DC | PRN

## 2021-11-01 MED ORDER — SODIUM CHLORIDE (PF) 0.9 % IJ SOLN
INTRAMUSCULAR | Status: AC
  Filled 2021-11-01: qty 50

## 2021-11-01 MED ORDER — ZOLPIDEM TARTRATE 10 MG PO TABS
10.0000 mg | ORAL_TABLET | Freq: Every day | ORAL | Status: DC
  Administered 2021-11-01: 10 mg via ORAL
  Filled 2021-11-01: qty 1

## 2021-11-01 MED ORDER — HYDROMORPHONE HCL 2 MG PO TABS
2.0000 mg | ORAL_TABLET | ORAL | Status: DC | PRN
  Administered 2021-11-01 – 2021-11-02 (×3): 2 mg via ORAL
  Filled 2021-11-01 (×3): qty 1

## 2021-11-01 MED ORDER — SODIUM CHLORIDE 0.9 % IR SOLN
Status: DC | PRN
  Administered 2021-11-01: 1000 mL

## 2021-11-01 MED ORDER — DEXAMETHASONE SODIUM PHOSPHATE 10 MG/ML IJ SOLN
INTRAMUSCULAR | Status: DC | PRN
  Administered 2021-11-01: 10 mg

## 2021-11-01 MED ORDER — ROPIVACAINE HCL 7.5 MG/ML IJ SOLN
INTRAMUSCULAR | Status: DC | PRN
  Administered 2021-11-01: 20 mL via PERINEURAL

## 2021-11-01 MED ORDER — ACETAMINOPHEN 10 MG/ML IV SOLN
1000.0000 mg | Freq: Once | INTRAVENOUS | Status: AC
  Administered 2021-11-01: 1000 mg via INTRAVENOUS
  Filled 2021-11-01: qty 100

## 2021-11-01 MED ORDER — FENTANYL CITRATE PF 50 MCG/ML IJ SOSY: 25.0000 ug | PREFILLED_SYRINGE | INTRAMUSCULAR | Status: DC | PRN

## 2021-11-01 MED ORDER — DIPHENHYDRAMINE HCL 12.5 MG/5ML PO ELIX: 12.5000 mg | ORAL_SOLUTION | ORAL | Status: DC | PRN

## 2021-11-01 MED ORDER — DEXAMETHASONE SODIUM PHOSPHATE 10 MG/ML IJ SOLN
10.0000 mg | Freq: Once | INTRAMUSCULAR | Status: AC
  Administered 2021-11-02: 10 mg via INTRAVENOUS
  Filled 2021-11-01: qty 1

## 2021-11-01 MED ORDER — MIDAZOLAM HCL 2 MG/2ML IJ SOLN
1.0000 mg | INTRAMUSCULAR | Status: DC
  Administered 2021-11-01: 1 mg via INTRAVENOUS
  Filled 2021-11-01: qty 2

## 2021-11-01 MED ORDER — LACTATED RINGERS IV SOLN: INTRAVENOUS | Status: DC

## 2021-11-01 MED ORDER — POLYETHYLENE GLYCOL 3350 17 G PO PACK: 17.0000 g | PACK | Freq: Every day | ORAL | Status: DC | PRN

## 2021-11-01 MED ORDER — METOCLOPRAMIDE HCL 5 MG/ML IJ SOLN: 5.0000 mg | Freq: Three times a day (TID) | INTRAMUSCULAR | Status: DC | PRN

## 2021-11-01 MED ORDER — ASPIRIN 325 MG PO TBEC
325.0000 mg | DELAYED_RELEASE_TABLET | Freq: Two times a day (BID) | ORAL | Status: DC
  Administered 2021-11-02: 325 mg via ORAL
  Filled 2021-11-01: qty 1

## 2021-11-01 MED ORDER — IRBESARTAN 150 MG PO TABS
150.0000 mg | ORAL_TABLET | Freq: Every day | ORAL | Status: DC
  Administered 2021-11-02: 150 mg via ORAL
  Filled 2021-11-01: qty 1

## 2021-11-01 MED ORDER — AMLODIPINE BESYLATE 5 MG PO TABS
5.0000 mg | ORAL_TABLET | Freq: Every day | ORAL | Status: DC
  Administered 2021-11-02: 5 mg via ORAL
  Filled 2021-11-01: qty 1

## 2021-11-01 MED ORDER — ACETAMINOPHEN 10 MG/ML IV SOLN: 1000.0000 mg | Freq: Four times a day (QID) | INTRAVENOUS | Status: DC

## 2021-11-01 MED ORDER — PHENOL 1.4 % MT LIQD: 1.0000 | OROMUCOSAL | Status: DC | PRN

## 2021-11-01 MED ORDER — ESCITALOPRAM OXALATE 10 MG PO TABS: 10.0000 mg | ORAL_TABLET | ORAL | Status: DC

## 2021-11-01 MED ORDER — ACETAMINOPHEN 160 MG/5ML PO SOLN: 325.0000 mg | ORAL | Status: DC | PRN

## 2021-11-01 MED ORDER — AMLODIPINE-OLMESARTAN 5-20 MG PO TABS: 1.0000 | ORAL_TABLET | Freq: Every day | ORAL | Status: DC

## 2021-11-01 MED ORDER — BUPIVACAINE IN DEXTROSE 0.75-8.25 % IT SOLN
INTRATHECAL | Status: DC | PRN
  Administered 2021-11-01: 1.8 mL via INTRATHECAL

## 2021-11-01 MED ORDER — OXYCODONE HCL 5 MG PO TABS: 5.0000 mg | ORAL_TABLET | Freq: Once | ORAL | Status: DC | PRN

## 2021-11-01 MED ORDER — 0.9 % SODIUM CHLORIDE (POUR BTL) OPTIME
TOPICAL | Status: DC | PRN
  Administered 2021-11-01: 1000 mL

## 2021-11-01 MED ORDER — PROPOFOL 500 MG/50ML IV EMUL
INTRAVENOUS | Status: DC | PRN
  Administered 2021-11-01: 75 ug/kg/min via INTRAVENOUS
  Administered 2021-11-01 (×2): 50 mg via INTRAVENOUS

## 2021-11-01 MED ORDER — FENTANYL CITRATE PF 50 MCG/ML IJ SOSY
50.0000 ug | PREFILLED_SYRINGE | INTRAMUSCULAR | Status: DC
  Administered 2021-11-01: 50 ug via INTRAVENOUS
  Filled 2021-11-01: qty 2

## 2021-11-01 MED ORDER — FAMOTIDINE 20 MG PO TABS
20.0000 mg | ORAL_TABLET | Freq: Every day | ORAL | Status: DC
  Administered 2021-11-02: 20 mg via ORAL
  Filled 2021-11-01: qty 1

## 2021-11-01 MED ORDER — SODIUM CHLORIDE 0.9 % IV SOLN: INTRAVENOUS | Status: DC

## 2021-11-01 MED ORDER — ONDANSETRON HCL 4 MG/2ML IJ SOLN: 4.0000 mg | Freq: Four times a day (QID) | INTRAMUSCULAR | Status: DC | PRN

## 2021-11-01 MED ORDER — ORAL CARE MOUTH RINSE: 15.0000 mL | Freq: Once | OROMUCOSAL | Status: AC

## 2021-11-01 MED ORDER — BUPIVACAINE LIPOSOME 1.3 % IJ SUSP
INTRAMUSCULAR | Status: DC | PRN
  Administered 2021-11-01: 20 mL

## 2021-11-01 MED ORDER — CHLORHEXIDINE GLUCONATE 0.12 % MT SOLN
15.0000 mL | Freq: Once | OROMUCOSAL | Status: AC
  Administered 2021-11-01: 15 mL via OROMUCOSAL

## 2021-11-01 MED ORDER — ACETAMINOPHEN 500 MG PO TABS
1000.0000 mg | ORAL_TABLET | Freq: Four times a day (QID) | ORAL | Status: AC
  Administered 2021-11-01 – 2021-11-02 (×4): 1000 mg via ORAL
  Filled 2021-11-01 (×4): qty 2

## 2021-11-01 MED ORDER — CYCLOBENZAPRINE HCL 10 MG PO TABS
10.0000 mg | ORAL_TABLET | Freq: Three times a day (TID) | ORAL | Status: DC | PRN
  Administered 2021-11-01 – 2021-11-02 (×2): 10 mg via ORAL
  Filled 2021-11-01 (×2): qty 1

## 2021-11-01 MED ORDER — OXYCODONE HCL 5 MG/5ML PO SOLN: 5.0000 mg | Freq: Once | ORAL | Status: DC | PRN

## 2021-11-01 MED ORDER — ONDANSETRON HCL 4 MG PO TABS
4.0000 mg | ORAL_TABLET | Freq: Four times a day (QID) | ORAL | Status: DC | PRN
  Filled 2021-11-01: qty 1

## 2021-11-01 MED ORDER — TRAMADOL HCL 50 MG PO TABS
50.0000 mg | ORAL_TABLET | Freq: Four times a day (QID) | ORAL | Status: DC | PRN
  Administered 2021-11-01: 50 mg via ORAL
  Administered 2021-11-02 (×2): 100 mg via ORAL
  Filled 2021-11-01 (×2): qty 2
  Filled 2021-11-01: qty 1

## 2021-11-01 MED ORDER — SODIUM CHLORIDE (PF) 0.9 % IJ SOLN
INTRAMUSCULAR | Status: AC
  Filled 2021-11-01: qty 10

## 2021-11-01 MED ORDER — CEFAZOLIN SODIUM-DEXTROSE 2-4 GM/100ML-% IV SOLN
2.0000 g | INTRAVENOUS | Status: AC
  Administered 2021-11-01: 2 g via INTRAVENOUS
  Filled 2021-11-01: qty 100

## 2021-11-01 MED ORDER — CEFAZOLIN SODIUM-DEXTROSE 2-4 GM/100ML-% IV SOLN
2.0000 g | Freq: Four times a day (QID) | INTRAVENOUS | Status: AC
  Administered 2021-11-01 – 2021-11-02 (×2): 2 g via INTRAVENOUS
  Filled 2021-11-01 (×2): qty 100

## 2021-11-01 MED ORDER — SODIUM CHLORIDE (PF) 0.9 % IJ SOLN
INTRAMUSCULAR | Status: DC | PRN
  Administered 2021-11-01: 60 mL

## 2021-11-01 MED ORDER — PHENYLEPHRINE 80 MCG/ML (10ML) SYRINGE FOR IV PUSH (FOR BLOOD PRESSURE SUPPORT)
PREFILLED_SYRINGE | INTRAVENOUS | Status: DC | PRN
  Administered 2021-11-01 (×2): 80 ug via INTRAVENOUS

## 2021-11-01 MED ORDER — POVIDONE-IODINE 10 % EX SWAB
2.0000 | Freq: Once | CUTANEOUS | Status: AC
  Administered 2021-11-01: 2 via TOPICAL

## 2021-11-01 MED ORDER — GABAPENTIN 300 MG PO CAPS
600.0000 mg | ORAL_CAPSULE | Freq: Three times a day (TID) | ORAL | Status: DC
  Administered 2021-11-01 – 2021-11-02 (×3): 600 mg via ORAL
  Filled 2021-11-01 (×3): qty 2

## 2021-11-01 MED ORDER — DEXAMETHASONE SODIUM PHOSPHATE 10 MG/ML IJ SOLN: 8.0000 mg | Freq: Once | INTRAMUSCULAR | Status: DC

## 2021-11-01 MED ORDER — BUPIVACAINE LIPOSOME 1.3 % IJ SUSP: 20.0000 mL | Freq: Once | INTRAMUSCULAR | Status: DC

## 2021-11-01 MED ORDER — MEPERIDINE HCL 50 MG/ML IJ SOLN: 6.2500 mg | INTRAMUSCULAR | Status: DC | PRN

## 2021-11-01 MED ORDER — FLEET ENEMA 7-19 GM/118ML RE ENEM: 1.0000 | ENEMA | Freq: Once | RECTAL | Status: DC | PRN

## 2021-11-01 SURGICAL SUPPLY — 52 items
ATTUNE PSFEM RTSZ5 NARCEM KNEE (Femur) IMPLANT
ATTUNE PSRP INSR SZ5 8 KNEE (Insert) IMPLANT
BAG COUNTER SPONGE SURGICOUNT (BAG) IMPLANT
BAG ZIPLOCK 12X15 (MISCELLANEOUS) ×1 IMPLANT
BASEPLATE TIBIAL ROTATING SZ 4 (Knees) IMPLANT
BLADE SAG 18X100X1.27 (BLADE) ×1 IMPLANT
BLADE SAW SGTL 11.0X1.19X90.0M (BLADE) ×1 IMPLANT
BNDG ELASTIC 6X5.8 VLCR STR LF (GAUZE/BANDAGES/DRESSINGS) ×1 IMPLANT
BOWL SMART MIX CTS (DISPOSABLE) ×1 IMPLANT
CEMENT HV SMART SET (Cement) ×2 IMPLANT
COVER SURGICAL LIGHT HANDLE (MISCELLANEOUS) ×1 IMPLANT
CUFF TOURN SGL QUICK 34 (TOURNIQUET CUFF) ×1
CUFF TRNQT CYL 34X4.125X (TOURNIQUET CUFF) ×1 IMPLANT
DRAPE INCISE IOBAN 66X45 STRL (DRAPES) ×1 IMPLANT
DRAPE U-SHAPE 47X51 STRL (DRAPES) ×1 IMPLANT
DRSG AQUACEL AG ADV 3.5X10 (GAUZE/BANDAGES/DRESSINGS) ×1 IMPLANT
DURAPREP 26ML APPLICATOR (WOUND CARE) ×1 IMPLANT
ELECT REM PT RETURN 15FT ADLT (MISCELLANEOUS) ×1 IMPLANT
GLOVE BIO SURGEON STRL SZ 6.5 (GLOVE) IMPLANT
GLOVE BIO SURGEON STRL SZ7.5 (GLOVE) IMPLANT
GLOVE BIO SURGEON STRL SZ8 (GLOVE) ×1 IMPLANT
GLOVE BIOGEL PI IND STRL 6.5 (GLOVE) IMPLANT
GLOVE BIOGEL PI IND STRL 7.0 (GLOVE) IMPLANT
GLOVE BIOGEL PI IND STRL 8 (GLOVE) ×1 IMPLANT
GOWN STRL REUS W/ TWL LRG LVL3 (GOWN DISPOSABLE) ×1 IMPLANT
GOWN STRL REUS W/ TWL XL LVL3 (GOWN DISPOSABLE) IMPLANT
GOWN STRL REUS W/TWL LRG LVL3 (GOWN DISPOSABLE) ×1
GOWN STRL REUS W/TWL XL LVL3 (GOWN DISPOSABLE) ×1
HANDPIECE INTERPULSE COAX TIP (DISPOSABLE) ×1
HOLDER FOLEY CATH W/STRAP (MISCELLANEOUS) IMPLANT
IMMOBILIZER KNEE 20 (SOFTGOODS) ×1
IMMOBILIZER KNEE 20 THIGH 36 (SOFTGOODS) ×1 IMPLANT
KIT TURNOVER KIT A (KITS) IMPLANT
MANIFOLD NEPTUNE II (INSTRUMENTS) ×1 IMPLANT
NS IRRIG 1000ML POUR BTL (IV SOLUTION) ×1 IMPLANT
PACK TOTAL KNEE CUSTOM (KITS) ×1 IMPLANT
PADDING CAST COTTON 6X4 STRL (CAST SUPPLIES) ×2 IMPLANT
PATELLA MEDIAL ATTUN 35MM KNEE (Knees) IMPLANT
PROTECTOR NERVE ULNAR (MISCELLANEOUS) ×1 IMPLANT
SET HNDPC FAN SPRY TIP SCT (DISPOSABLE) ×1 IMPLANT
SPIKE FLUID TRANSFER (MISCELLANEOUS) ×1 IMPLANT
STRIP CLOSURE SKIN 1/2X4 (GAUZE/BANDAGES/DRESSINGS) ×2 IMPLANT
SUT MNCRL AB 4-0 PS2 18 (SUTURE) ×1 IMPLANT
SUT STRATAFIX 0 PDS 27 VIOLET (SUTURE) ×1
SUT VIC AB 2-0 CT1 27 (SUTURE) ×3
SUT VIC AB 2-0 CT1 TAPERPNT 27 (SUTURE) ×3 IMPLANT
SUTURE STRATFX 0 PDS 27 VIOLET (SUTURE) ×1 IMPLANT
TRAY FOLEY MTR SLVR 14FR STAT (SET/KITS/TRAYS/PACK) IMPLANT
TRAY FOLEY MTR SLVR 16FR STAT (SET/KITS/TRAYS/PACK) ×1 IMPLANT
TUBE SUCTION HIGH CAP CLEAR NV (SUCTIONS) ×1 IMPLANT
WATER STERILE IRR 1000ML POUR (IV SOLUTION) ×2 IMPLANT
WRAP KNEE MAXI GEL POST OP (GAUZE/BANDAGES/DRESSINGS) ×1 IMPLANT

## 2021-11-01 NOTE — Anesthesia Preprocedure Evaluation (Addendum)
Anesthesia Evaluation  Patient identified by MRN, date of birth, ID band Patient awake    Reviewed: Allergy & Precautions, H&P , NPO status , Patient's Chart, lab work & pertinent test results, reviewed documented beta blocker date and time   History of Anesthesia Complications Negative for: history of anesthetic complications  Airway Mallampati: I  TM Distance: >3 FB Neck ROM: full    Dental  (+) Teeth Intact   Pulmonary neg pulmonary ROS,    Pulmonary exam normal breath sounds clear to auscultation       Cardiovascular Exercise Tolerance: Good hypertension, On Medications (-) CAD (cardiac cath 5 years ago with no CAD)  Rhythm:regular Rate:Normal  Dextrocardia/situs inversus   Neuro/Psych Anxiety Chronic back pain on vicodin prn (takes at least once a day for the past two weeks since stopping naproxen in preparation for surgery) negative psych ROS   GI/Hepatic Neg liver ROS, GERD  ,  Endo/Other  negative endocrine ROS  Renal/GU negative Renal ROS     Musculoskeletal  (+) Arthritis , Osteoarthritis,    Abdominal   Peds  Hematology negative hematology ROS (+)   Anesthesia Other Findings   Reproductive/Obstetrics negative OB ROS                            Anesthesia Physical  Anesthesia Plan  ASA: 3  Anesthesia Plan: MAC, Regional and Spinal   Post-op Pain Management:    Induction:   PONV Risk Score and Plan: 2 and Propofol infusion  Airway Management Planned: Natural Airway and Nasal Cannula  Additional Equipment: None  Intra-op Plan:   Post-operative Plan:   Informed Consent: I have reviewed the patients History and Physical, chart, labs and discussed the procedure including the risks, benefits and alternatives for the proposed anesthesia with the patient or authorized representative who has indicated his/her understanding and acceptance.     Dental Advisory  Given  Plan Discussed with: CRNA, Surgeon and Anesthesiologist  Anesthesia Plan Comments:         Anesthesia Quick Evaluation

## 2021-11-01 NOTE — Plan of Care (Signed)
Problem: Education: Goal: Knowledge of the prescribed therapeutic regimen will improve Outcome: Progressing   Problem: Activity: Goal: Ability to avoid complications of mobility impairment will improve Outcome: Progressing   Problem: Clinical Measurements: Goal: Postoperative complications will be avoided or minimized Outcome: Progressing   Problem: Pain Management: Goal: Pain level will decrease with appropriate interventions Outcome: [place], RN 11/01/21 8:03 PM

## 2021-11-01 NOTE — Anesthesia Postprocedure Evaluation (Signed)
Anesthesia Post Note  Patient: Hailey Bates  Procedure(s) Performed: TOTAL KNEE ARTHROPLASTY (Right: Knee)     Patient location during evaluation: PACU Anesthesia Type: Regional, MAC and Spinal Level of consciousness: oriented and awake and alert Pain management: pain level controlled Vital Signs Assessment: post-procedure vital signs reviewed and stable Respiratory status: spontaneous breathing, respiratory function stable and patient connected to nasal cannula oxygen Cardiovascular status: blood pressure returned to baseline and stable Postop Assessment: no headache, no backache and no apparent nausea or vomiting Anesthetic complications: no   No notable events documented.  Last Vitals:  Vitals:   11/01/21 1531 11/01/21 1647  BP:  (!) 143/87  Pulse: 77 85  Resp: 12 14  Temp:  36.6 C  SpO2: 100% 98%    Last Pain:  Vitals:   11/01/21 1839  TempSrc:   PainSc: 4                  Shandel Busic

## 2021-11-01 NOTE — Transfer of Care (Signed)
Immediate Anesthesia Transfer of Care Note  Patient: Hailey Bates  Procedure(s) Performed: Procedure(s): TOTAL KNEE ARTHROPLASTY (Right)  Patient Location: PACU  Anesthesia Type:Spinal  Level of Consciousness: awake, alert  and oriented  Airway & Oxygen Therapy: Patient Spontanous Breathing  Post-op Assessment: Report given to RN and Post -op Vital signs reviewed and stable  Post vital signs: Reviewed and stable  Last Vitals:  Vitals:   11/01/21 1230 11/01/21 1235  BP: (!) 142/83 (!) 139/90  Pulse: 87 89  Resp: 14 16  Temp:    SpO2: 78% 676%    Complications: No apparent anesthesia complications

## 2021-11-01 NOTE — Op Note (Signed)
OPERATIVE REPORT-TOTAL KNEE ARTHROPLASTY   Pre-operative diagnosis- Osteoarthritis  Right knee(s)  Post-operative diagnosis- Osteoarthritis Right knee(s)  Procedure-  Right  Total Knee Arthroplasty  Surgeon- Dione Plover. Melva Faux, MD  Assistant- Molli Barrows, PA-C   Anesthesia-   Adductor canal block and spinal  EBL 25 ml   Drains None  Tourniquet time-  Total Tourniquet Time Documented: Thigh (Right) - 34 minutes Total: Thigh (Right) - 34 minutes     Complications- None  Condition-PACU - hemodynamically stable.   Brief Clinical Note  Hailey Bates is a 66 y.o. year old female with end stage OA of his right knee with progressively worsening pain and dysfunction. He has constant pain, with activity and at rest and significant functional deficits with difficulties even with ADLs. He has had extensive non-op management including analgesics, injections of cortisone and viscosupplements, and home exercise program, but remains in significant pain with significant dysfunction. Radiographs show bone on bone arthritis lateral and patellofemoral. He presents now for right Total Knee Arthroplasty.     Procedure in detail---   The patient is brought into the operating room and positioned supine on the operating table. After successful administration of  Adductor canal block and spinal,   a tourniquet is placed high on the  Right thigh(s) and the lower extremity is prepped and draped in the usual sterile fashion. Time out is performed by the operating team and then the  Right lower extremity is wrapped in Esmarch, knee flexed and the tourniquet inflated to 300 mmHg.       A midline incision is made with a ten blade through the subcutaneous tissue to the level of the extensor mechanism. A fresh blade is used to make a medial parapatellar arthrotomy. Soft tissue over the proximal medial tibia is subperiosteally elevated to the joint line with a knife and into the semimembranosus bursa with a  Cobb elevator. Soft tissue over the proximal lateral tibia is elevated with attention being paid to avoiding the patellar tendon on the tibial tubercle. The patella is everted, knee flexed 90 degrees and the ACL and PCL are removed. Findings are bone on bone lateral and patellofemoral with large globalosteophytes        The drill is used to create a starting hole in the distal femur and the canal is thoroughly irrigated with sterile saline to remove the fatty contents. The 5 degree Right  valgus alignment guide is placed into the femoral canal and the distal femoral cutting block is pinned to remove 9 mm off the distal femur. Resection is made with an oscillating saw.      The tibia is subluxed forward and the menisci are removed. The extramedullary alignment guide is placed referencing proximally at the medial aspect of the tibial tubercle and distally along the second metatarsal axis and tibial crest. The block is pinned to remove 41m off the more deficient lateral  side. Resection is made with an oscillating saw. Size 4is the most appropriate size for the tibia and the proximal tibia is prepared with the modular drill and keel punch for that size.      The femoral sizing guide is placed and size 5 is most appropriate. Rotation is marked off the epicondylar axis and confirmed by creating a rectangular flexion gap at 90 degrees. The size 5 cutting block is pinned in this rotation and the anterior, posterior and chamfer cuts are made with the oscillating saw. The intercondylar block is then placed and that cut is  made.      Trial size 4 tibial component, trial size 5 narrow posterior stabilized femur and a 10  mm posterior stabilized rotating platform insert trial is placed. Full extension is achieved with excellent varus/valgus and anterior/posterior balance throughout full range of motion. The patella is everted and thickness measured to be 22  mm. Free hand resection is taken to 12 mm, a 35 template is  placed, lug holes are drilled, trial patella is placed, and it tracks normally. Osteophytes are removed off the posterior femur with the trial in place. All trials are removed and the cut bone surfaces prepared with pulsatile lavage. Cement is mixed and once ready for implantation, the size 4 tibial implant, size  5 narrow posterior stabilized femoral component, and the size 35 patella are cemented in place and the patella is held with the clamp. The trial insert is placed and the knee held in full extension. The Exparel (20 ml mixed with 60 ml saline) is injected into the extensor mechanism, posterior capsule, medial and lateral gutters and subcutaneous tissues.  All extruded cement is removed and once the cement is hard the permanent 10 mm posterior stabilized rotating platform insert is placed into the tibial tray.      The wound is copiously irrigated with saline solution and the extensor mechanism closed with # 0 Stratofix suture. The tourniquet is released for a total tourniquet time of 34  minutes. Flexion against gravity is 140 degrees and the patella tracks normally. Subcutaneous tissue is closed with 2.0 vicryl and subcuticular with running 4.0 Monocryl. The incision is cleaned and dried and steri-strips and a bulky sterile dressing are applied. The limb is placed into a knee immobilizer and the patient is awakened and transported to recovery in stable condition.      Please note that a surgical assistant was a medical necessity for this procedure in order to perform it in a safe and expeditious manner. Surgical assistant was necessary to retract the ligaments and vital neurovascular structures to prevent injury to them and also necessary for proper positioning of the limb to allow for anatomic placement of the prosthesis.   Dione Plover Kinsly Hild, MD    11/01/2021, 2:24 PM

## 2021-11-01 NOTE — Progress Notes (Signed)
Orthopedic Tech Progress Note Patient Details:  Hailey Bates January 21, 1956 [id number]  CPM Right Knee CPM Right Knee: On Right Knee Flexion (Degrees): 40 Right Knee Extension (Degrees): 10  Post Interventions Patient Tolerated: Well  Hailey Bates [name] 11/01/2021, 2:54 PM

## 2021-11-01 NOTE — Discharge Instructions (Signed)
 Frank Aluisio, MD Total Joint Specialist [facility] [address] [place], [place] [address] [phone]  TOTAL KNEE REPLACEMENT POSTOPERATIVE DIRECTIONS    Knee Rehabilitation, Guidelines Following Surgery  Results after knee surgery are often greatly improved when you follow the exercise, range of motion and muscle strengthening exercises prescribed by your doctor. Safety measures are also important to protect the knee from further injury. If any of these exercises cause you to have increased pain or swelling in your knee joint, decrease the amount until you are comfortable again and slowly increase them. If you have problems or questions, call your caregiver or physical therapist for advice.   BLOOD CLOT PREVENTION Take a 325 mg Aspirin two times a day for three weeks following surgery. Then take an 81 mg Aspirin once a day for three weeks. Then discontinue Aspirin. You may resume your vitamins/supplements upon discharge from the hospital. Do not take any NSAIDs (Advil, Aleve, Ibuprofen, Meloxicam, etc.) until you have discontinued the 325 mg Aspirin.  HOME CARE INSTRUCTIONS  Remove items at home which could result in a fall. This includes throw rugs or furniture in walking pathways.  ICE to the affected knee as much as tolerated. Icing helps control swelling. If the swelling is well controlled you will be more comfortable and rehab easier. Continue to use ice on the knee for pain and swelling from surgery. You may notice swelling that will progress down to the foot and ankle. This is normal after surgery. Elevate the leg when you are not up walking on it.    Continue to use the breathing machine which will help keep your temperature down. It is common for your temperature to cycle up and down following surgery, especially at night when you are not up moving around and exerting yourself. The breathing machine keeps your lungs expanded and your temperature  down. Do not place pillow under the operative knee, focus on keeping the knee straight while resting  DIET You may resume your previous home diet once you are discharged from the hospital.  DRESSING / WOUND CARE / SHOWERING Keep your bulky bandage on for 2 days. On the third post-operative day you may remove the Ace bandage and gauze. There is a waterproof adhesive bandage on your skin which will stay in place until your first follow-up appointment. Once you remove this you will not need to place another bandage You may begin showering 3 days following surgery, but do not submerge the incision under water.  ACTIVITY For the first 5 days, the key is rest and control of pain and swelling Do your home exercises twice a day starting on post-operative day 3. On the days you go to physical therapy, just do the home exercises once that day. You should rest, ice and elevate the leg for 50 minutes out of every hour. Get up and walk/stretch for 10 minutes per hour. After 5 days you can increase your activity slowly as tolerated. Walk with your walker as instructed. Use the walker until you are comfortable transitioning to a cane. Walk with the cane in the opposite hand of the operative leg. You may discontinue the cane once you are comfortable and walking steadily. Avoid periods of inactivity such as sitting longer than an hour when not asleep. This helps prevent blood clots.  You may discontinue the knee immobilizer once you are able to perform a straight leg raise while lying down. You may resume a sexual relationship in one month   or when given the OK by your doctor.  You may return to work once you are cleared by your doctor.  Do not drive a car for 6 weeks or until released by your surgeon.  Do not drive while taking narcotics.  TED HOSE STOCKINGS Wear the elastic stockings on both legs for three weeks following surgery during the day. You may remove them at night for sleeping.  WEIGHT  BEARING Weight bearing as tolerated with assist device (walker, cane, etc) as directed, use it as long as suggested by your surgeon or therapist, typically at least 4-6 weeks.  POSTOPERATIVE CONSTIPATION PROTOCOL Constipation - defined medically as fewer than three stools per week and severe constipation as less than one stool per week.  One of the most common issues patients have following surgery is constipation.  Even if you have a regular bowel pattern at home, your normal regimen is likely to be disrupted due to multiple reasons following surgery.  Combination of anesthesia, postoperative narcotics, change in appetite and fluid intake all can affect your bowels.  In order to avoid complications following surgery, here are some recommendations in order to help you during your recovery period.  Colace (docusate) - Pick up an over-the-counter form of Colace or another stool softener and take twice a day as long as you are requiring postoperative pain medications.  Take with a full glass of water daily.  If you experience loose stools or diarrhea, hold the colace until you stool forms back up. If your symptoms do not get better within 1 week or if they get worse, check with your doctor. Dulcolax (bisacodyl) - Pick up over-the-counter and take as directed by the product packaging as needed to assist with the movement of your bowels.  Take with a full glass of water.  Use this product as needed if not relieved by Colace only.  MiraLax (polyethylene glycol) - Pick up over-the-counter to have on hand. MiraLax is a solution that will increase the amount of water in your bowels to assist with bowel movements.  Take as directed and can mix with a glass of water, juice, soda, coffee, or tea. Take if you go more than two days without a movement. Do not use MiraLax more than once per day. Call your doctor if you are still constipated or irregular after using this medication for 7 days in a row.  If you continue  to have problems with postoperative constipation, please contact the office for further assistance and recommendations.  If you experience "the worst abdominal pain ever" or develop nausea or vomiting, please contact the office immediatly for further recommendations for treatment.  ITCHING If you experience itching with your medications, try taking only a single pain pill, or even half a pain pill at a time.  You can also use Benadryl over the counter for itching or also to help with sleep.   MEDICATIONS See your medication summary on the "After Visit Summary" that the nursing staff will review with you prior to discharge.  You may have some home medications which will be placed on hold until you complete the course of blood thinner medication.  It is important for you to complete the blood thinner medication as prescribed by your surgeon.  Continue your approved medications as instructed at time of discharge.  PRECAUTIONS If you experience chest pain or shortness of breath - call 911 immediately for transfer to the hospital emergency department.  If you develop a fever greater that 101 F,   purulent drainage from wound, increased redness or drainage from wound, foul odor from the wound/dressing, or calf pain - CONTACT YOUR SURGEON.                                                   FOLLOW-UP APPOINTMENTS Make sure you keep all of your appointments after your operation with your surgeon and caregivers. You should call the office at the above phone number and make an appointment for approximately two weeks after the date of your surgery or on the date instructed by your surgeon outlined in the "After Visit Summary".  RANGE OF MOTION AND STRENGTHENING EXERCISES  Rehabilitation of the knee is important following a knee injury or an operation. After just a few days of immobilization, the muscles of the thigh which control the knee become weakened and shrink (atrophy). Knee exercises are designed to build up  the tone and strength of the thigh muscles and to improve knee motion. Often times heat used for twenty to thirty minutes before working out will loosen up your tissues and help with improving the range of motion but do not use heat for the first two weeks following surgery. These exercises can be done on a training (exercise) mat, on the floor, on a table or on a bed. Use what ever works the best and is most comfortable for you Knee exercises include:  Leg Lifts - While your knee is still immobilized in a splint or cast, you can do straight leg raises. Lift the leg to 60 degrees, hold for 3 sec, and slowly lower the leg. Repeat 10-20 times 2-3 times daily. Perform this exercise against resistance later as your knee gets better.  Quad and Hamstring Sets - Tighten up the muscle on the front of the thigh (Quad) and hold for 5-10 sec. Repeat this 10-20 times hourly. Hamstring sets are done by pushing the foot backward against an object and holding for 5-10 sec. Repeat as with quad sets.  Leg Slides: Lying on your back, slowly slide your foot toward your buttocks, bending your knee up off the floor (only go as far as is comfortable). Then slowly slide your foot back down until your leg is flat on the floor again. Angel Wings: Lying on your back spread your legs to the side as far apart as you can without causing discomfort.  A rehabilitation program following serious knee injuries can speed recovery and prevent re-injury in the future due to weakened muscles. Contact your doctor or a physical therapist for more information on knee rehabilitation.   POST-OPERATIVE OPIOID TAPER INSTRUCTIONS: It is important to wean off of your opioid medication as soon as possible. If you do not need pain medication after your surgery it is ok to stop day one. Opioids include: Codeine, Hydrocodone(Norco, Vicodin), Oxycodone(Percocet, oxycontin) and hydromorphone amongst others.  Long term and even short term use of opiods can  cause: Increased pain response Dependence Constipation Depression Respiratory depression And more.  Withdrawal symptoms can include Flu like symptoms Nausea, vomiting And more Techniques to manage these symptoms Hydrate well Eat regular healthy meals Stay active Use relaxation techniques(deep breathing, meditating, yoga) Do Not substitute Alcohol to help with tapering If you have been on opioids for less than two weeks and do not have pain than it is ok to stop all together.  Plan   to wean off of opioids This plan should start within one week post op of your joint replacement. Maintain the same interval or time between taking each dose and first decrease the dose.  Cut the total daily intake of opioids by one tablet each day Next start to increase the time between doses. The last dose that should be eliminated is the evening dose.   IF YOU ARE TRANSFERRED TO A SKILLED REHAB FACILITY If the patient is transferred to a skilled rehab facility following release from the hospital, a list of the current medications will be sent to the facility for the patient to continue.  When discharged from the skilled rehab facility, please have the facility set up the patient's Home Health Physical Therapy prior to being released. Also, the skilled facility will be responsible for providing the patient with their medications at time of release from the facility to include their pain medication, the muscle relaxants, and their blood thinner medication. If the patient is still at the rehab facility at time of the two week follow up appointment, the skilled rehab facility will also need to assist the patient in arranging follow up appointment in our office and any transportation needs.  MAKE SURE YOU:  Understand these instructions.  Get help right away if you are not doing well or get worse.   DENTAL ANTIBIOTICS:  In most cases prophylactic antibiotics for Dental procdeures after total joint surgery are  not necessary.  Exceptions are as follows:  1. History of prior total joint infection  2. Severely immunocompromised (Organ Transplant, cancer chemotherapy, Rheumatoid biologic medications such as Humera)  3. Poorly controlled diabetes (A1C &gt; 8.0, blood glucose over 200)  If you have one of these conditions, contact your surgeon for an antibiotic prescription, prior to your dental procedure.    Pick up stool softner and laxative for home use following surgery while on pain medications. Do not submerge incision under water. Please use good hand washing techniques while changing dressing each day. May shower starting three days after surgery. Please use a clean towel to pat the incision dry following showers. Continue to use ice for pain and swelling after surgery. Do not use any lotions or creams on the incision until instructed by your surgeon.  

## 2021-11-01 NOTE — Anesthesia Procedure Notes (Addendum)
  Anesthesia Regional Block: Adductor canal block   Pre-Anesthetic Checklist: , timeout performed,  Correct Patient, Correct Site, Correct Laterality,  Correct Procedure, Correct Position, site marked,  Risks and benefits discussed,  Surgical consent,  Pre-op evaluation,  At surgeon's request and post-op pain management  Laterality: Right  Prep: chloraprep       Needles:  Injection technique: Single-shot  Needle Type: Echogenic Stimulator Needle     Needle Length: 5cm  Needle Gauge: 22     Additional Needles:   Procedures:, nerve stimulator,,, ultrasound used (permanent image in chart),,    Narrative:  Start time: 11/01/2021 12:35 PM End time: 11/01/2021 12:41 PM Injection made incrementally with aspirations every 5 mL.  Performed by: Personally  Anesthesiologist: Janeece Riggers, MD  Additional Notes: Functioning IV was confirmed and monitors were applied.  A 90m 22ga Arrow echogenic stimulator needle was used. Sterile prep and drape,hand hygiene and sterile gloves were used. Ultrasound guidance: relevant anatomy identified, needle position confirmed, local anesthetic spread visualized around nerve(s)., vascular puncture avoided.  Image printed for medical record. Negative aspiration and negative test dose prior to incremental administration of local anesthetic. The patient tolerated the procedure well.

## 2021-11-01 NOTE — Evaluation (Signed)
Physical Therapy Evaluation Patient Details Name: Hailey Bates MRN: [id number] DOB: [birth year] Today's Date: 11/01/2021  History of Present Illness  Pt is a 66yo female presenting s/p R-TKA on 11/01/21. PMH: GERD, HTN, chronic pain syndrome, inflammatory neuropathy.   Clinical Impression  Hailey Bates is a 66 y.o. female POD 0 s/p R-TKA. Patient reports modified independence using SPC and RW for limited household mobility at baseline, "I don't get out much." Patient is now limited by functional impairments (see PT problem list below) and requires supervision for bed mobility and min assist for transfers; pt has history of inflammatory neuropathy and has reduced use of RUE (see below). Patient was able to ambulate 15 feet with RW and min guard level of assist. Patient instructed in exercise to facilitate ROM and circulation to manage edema. Provided incentive spirometer and with Vcs pt able to achieve 1515m. Patient will benefit from continued skilled PT interventions to address impairments and progress towards PLOF. Acute PT will follow to progress mobility and stair training in preparation for safe discharge home.       Recommendations for follow up therapy are one component of a multi-disciplinary discharge planning process, led by the attending physician.  Recommendations may be updated based on patient status, additional functional criteria and insurance authorization.  Follow Up Recommendations Follow physician's recommendations for discharge plan and follow up therapies      Assistance Recommended at Discharge Frequent or constant Supervision/Assistance  Patient can return home with the following  A little help with walking and/or transfers;A little help with bathing/dressing/bathroom;Assistance with cooking/housework;Assist for transportation;Help with stairs or ramp for entrance    Equipment Recommendations None recommended by PT  Recommendations for Other Services        Functional Status Assessment Patient has had a recent decline in their functional status and demonstrates the ability to make significant improvements in function in a reasonable and predictable amount of time.     Precautions / Restrictions Precautions Precautions: Fall Required Braces or Orthoses: Knee Immobilizer - Right Knee Immobilizer - Right: On when out of bed or walking;Discontinue once straight leg raise with < 10 degree lag Restrictions Weight Bearing Restrictions: No Other Position/Activity Restrictions: wbat      Mobility  Bed Mobility Overal bed mobility: Needs Assistance Bed Mobility: Supine to Sit     Supine to sit: Supervision     General bed mobility comments: For safety only, VCs for sequencing and use of bed rail.    Transfers Overall transfer level: Needs assistance Equipment used: Rolling walker (2 wheels) Transfers: Sit to/from Stand Sit to Stand: Min assist, From elevated surface           General transfer comment: Pt required min assist for lift assist from elevated surface, VCs for sequencing, pt able to use LUE and LLE primarily and once standing can WB on RUE but is unable to use it to assist with transfer. Pt completed lateral weightshifts and 1-2reps of standing marches to demonstrate weightbearing status and pt did not buckle.    Ambulation/Gait Ambulation/Gait assistance: Min guard Gait Distance (Feet): 15 Feet Assistive device: Rolling walker (2 wheels) Gait Pattern/deviations: Step-to pattern Gait velocity: decreased     General Gait Details: Pt ambulated with RW and min guard, no physical assist rqeuired or overt LOB noted. Pt tentative and fearful but gait improved with increased distance.  Stairs            Wheelchair Mobility    Modified Rankin (Stroke Patients  Only)       Balance Overall balance assessment: Needs assistance Sitting-balance support: Feet supported, No upper extremity supported Sitting  balance-Leahy Scale: Good     Standing balance support: Reliant on assistive device for balance, During functional activity, Bilateral upper extremity supported Standing balance-Leahy Scale: Poor                               Pertinent Vitals/Pain Pain Assessment Pain Assessment: 0-10 Pain Score: 3  Pain Location: right knee Pain Descriptors / Indicators: Burning, Operative site guarding Pain Intervention(s): Limited activity within patient's tolerance, Monitored during session, Repositioned, Ice applied    Home Living Family/patient expects to be discharged to:: Private residence Living Arrangements: Alone Available Help at Discharge: Family;Available 24 hours/day;Personal care attendant (Daughter, Maudry Diego, Care attendent 7/days for 3 hours during the day, Daughter Baxter Flattery works days) Type of Home: House Home Access: Stairs to enter Entrance Stairs-Rails: Chemical engineer of Steps: 3   Home Layout: One level [place]: Shower seat;Grab bars - toilet;Cane - single Barista (2 wheels)      Prior Function Prior Level of Function : Independent/Modified Independent;Driving;Working/employed             Mobility Comments: SPC and RW interchangebly, most important when "I get up and walk" ADLs Comments: IND     Hand Dominance        Extremity/Trunk Assessment   Upper Extremity Assessment Upper Extremity Assessment: Overall WFL for tasks assessed;RUE deficits/detail RUE Deficits / Details: Pt has inflammatory neuropathy and holds hand in intrinsic plus position, able to weightbear using RW but it is weaker, still recovering. RUE Sensation: history of peripheral neuropathy    Lower Extremity Assessment Lower Extremity Assessment: RLE deficits/detail;LLE deficits/detail RLE Deficits / Details: MMT ank DF/PF 4/5, no extensor lag noted RLE Sensation: WNL LLE Deficits / Details: MMT ank DF/PF 4/5 LLE Sensation: WNL    Cervical /  Trunk Assessment Cervical / Trunk Assessment: Kyphotic  Communication   Communication: No difficulties  Cognition Arousal/Alertness: Awake/alert Behavior During Therapy: WFL for tasks assessed/performed Overall Cognitive Status: Within Functional Limits for tasks assessed                                          General Comments General comments (skin integrity, edema, etc.): daughter Baxter Flattery present for start of session    Exercises Total Joint Exercises Ankle Circles/Pumps: AROM, Both, 10 reps   Assessment/Plan    PT Assessment Patient needs continued PT services  PT Problem List Decreased strength;Decreased range of motion;Decreased activity tolerance;Decreased balance;Decreased mobility;Decreased coordination;Pain       PT Treatment Interventions DME instruction;Gait training;Stair training;Functional mobility training;Therapeutic activities;Therapeutic exercise;Balance training;Neuromuscular re-education;Patient/family education    PT Goals (Current goals can be found in the Care Plan section)  Acute Rehab PT Goals Patient Stated Goal: Gardening, walking outside PT Goal Formulation: With patient Time For Goal Achievement: 11/08/21 Potential to Achieve Goals: Good    Frequency 7X/week     Co-evaluation               AM-PAC PT "6 Clicks" Mobility  Outcome Measure Help needed turning from your back to your side while in a flat bed without using bedrails?: None Help needed moving from lying on your back to sitting on the side of a flat bed without using  bedrails?: A Little Help needed moving to and from a bed to a chair (including a wheelchair)?: A Little Help needed standing up from a chair using your arms (e.g., wheelchair or bedside chair)?: A Little Help needed to walk in hospital room?: A Little Help needed climbing 3-5 steps with a railing? : A Little 6 Click Score: 19    End of Session Equipment Utilized During Treatment: Gait belt;Right  knee immobilizer Activity Tolerance: Patient tolerated treatment well;No increased pain Patient left: in chair;with call bell/phone within reach;with chair alarm set;with SCD's reapplied Nurse Communication: Mobility status PT Visit Diagnosis: Pain;Difficulty in walking, not elsewhere classified (R26.2) Pain - Right/Left: Right Pain - part of body: Knee    Time: 5809-9833 PT Time Calculation (min) (ACUTE ONLY): 26 min   Charges:   PT Evaluation $PT Eval Low Complexity: 1 Low PT Treatments $Gait Training: 8-22 mins        Coolidge Breeze, PT, DPT [facility] Office: [phone] Weekend pager: [phone]  Coolidge Breeze 11/01/2021, 7:07 PM

## 2021-11-01 NOTE — Anesthesia Procedure Notes (Signed)
Spinal  Patient location during procedure: OR Start time: 11/01/2021 1:19 PM Reason for block: surgical anesthesia Staffing Performed: resident/CRNA  Anesthesiologist: Janeece Riggers, MD Resident/CRNA: Gerald Leitz, CRNA Performed by: Gerald Leitz, CRNA Authorized by: Janeece Riggers, MD   Preanesthetic Checklist Completed: patient identified, IV checked, site marked, risks and benefits discussed, surgical consent, monitors and equipment checked, pre-op evaluation and timeout performed Spinal Block Patient position: sitting Prep: DuraPrep Patient monitoring: heart rate, cardiac monitor, continuous pulse ox and blood pressure Approach: midline Location: L3-4 Injection technique: single-shot Needle Needle type: Sprotte  Needle gauge: 25 G Needle length: 9 cm Assessment Sensory level: T4 Events: CSF return

## 2021-11-01 NOTE — Interval H&P Note (Signed)
History and Physical Interval Note:  11/01/2021 10:38 AM  Hailey Bates  has presented today for surgery, with the diagnosis of right knee osteoarthritis.  The various methods of treatment have been discussed with the patient and family. After consideration of risks, benefits and other options for treatment, the patient has consented to  Procedure(s): TOTAL KNEE ARTHROPLASTY (Right) as a surgical intervention.  The patient's history has been reviewed, patient examined, no change in status, stable for surgery.  I have reviewed the patient's chart and labs.  Questions were answered to the patient's satisfaction.     Pilar Plate Lan Mcneill

## 2021-11-02 ENCOUNTER — Encounter (HOSPITAL_COMMUNITY): Payer: Self-pay | Admitting: Orthopedic Surgery

## 2021-11-02 DIAGNOSIS — M1711 Unilateral primary osteoarthritis, right knee: Secondary | ICD-10-CM | POA: Diagnosis not present

## 2021-11-02 LAB — CBC
HCT: 36.2 % (ref 36.0–46.0)
Hemoglobin: 11.4 g/dL — ABNORMAL LOW (ref 12.0–15.0)
MCH: 27.9 pg (ref 26.0–34.0)
MCHC: 31.5 g/dL (ref 30.0–36.0)
MCV: 88.7 fL (ref 80.0–100.0)
Platelets: 206 10*3/uL (ref 150–400)
RBC: 4.08 MIL/uL (ref 3.87–5.11)
RDW: 13.5 % (ref 11.5–15.5)
WBC: 11.7 10*3/uL — ABNORMAL HIGH (ref 4.0–10.5)
nRBC: 0 % (ref 0.0–0.2)

## 2021-11-02 LAB — BASIC METABOLIC PANEL WITH GFR
Anion gap: 7 (ref 5–15)
BUN: 19 mg/dL (ref 8–23)
CO2: 24 mmol/L (ref 22–32)
Calcium: 8.8 mg/dL — ABNORMAL LOW (ref 8.9–10.3)
Chloride: 108 mmol/L (ref 98–111)
Creatinine, Ser: 0.86 mg/dL (ref 0.44–1.00)
GFR, Estimated: >60 mL/min
Glucose, Bld: 140 mg/dL — ABNORMAL HIGH (ref 70–99)
Potassium: 3.8 mmol/L (ref 3.5–5.1)
Sodium: 139 mmol/L (ref 135–145)

## 2021-11-02 MED ORDER — HYDROMORPHONE HCL 2 MG PO TABS: 2.0000 mg | ORAL_TABLET | Freq: Four times a day (QID) | ORAL | 0 refills | Status: DC | PRN

## 2021-11-02 MED ORDER — TRAMADOL HCL 50 MG PO TABS: 50.0000 mg | ORAL_TABLET | Freq: Four times a day (QID) | ORAL | 0 refills | Status: DC | PRN

## 2021-11-02 MED ORDER — ASPIRIN 325 MG PO TBEC: 325.0000 mg | DELAYED_RELEASE_TABLET | Freq: Two times a day (BID) | ORAL | 0 refills | Status: AC

## 2021-11-02 NOTE — TOC Transition Note (Signed)
Transition of Care [facility]) - CM/SW Discharge Note   Patient Details  Name: Hailey Bates MRN: [id number] Date of Birth: 1955-04-09  Transition of Care [facility]) CM/SW Contact:  Lennart Pall, LCSW Phone Number: 11/02/2021, 10:42 AM   Clinical Narrative:    Met with pt and confirming she has all needed DME at home.  OPPT already set up with [facility].  No TOC needs.   Final next level of care: OP Rehab Barriers to Discharge: No Barriers Identified   Patient Goals and CMS Choice Patient states their goals for this hospitalization and ongoing recovery are:: return home      Discharge Placement                       Discharge Plan and Services                DME Arranged: N/A DME Agency: NA                  Social Determinants of Health (SDOH) Interventions     Readmission Risk Interventions     No data to display

## 2021-11-02 NOTE — Progress Notes (Signed)
   Subjective: 1 Day Post-Op Procedure(s) (LRB): TOTAL KNEE ARTHROPLASTY (Right) Patient reports pain as mild.   Patient seen in rounds by Dr. Wynelle Link. Patient is well, and has had no acute complaints or problems No issues overnight. Denies chest pain, SOB, or calf pain. Foley catheter removed this AM.  We will continue therapy today, ambulated 15' yesterday.   Objective: Vital signs in last 24 hours: Temp:  [97.8 F (36.6 C)-98.8 F (37.1 C)] 97.8 F (36.6 C) (10/03 0950) Pulse Rate:  [69-104] 85 (10/03 0950) Resp:  [10-18] 16 (10/03 0950) BP: (109-143)/(62-93) 126/71 (10/03 0950) SpO2:  [95 %-100 %] 98 % (10/03 0950) Weight:  [87.1 kg] 87.1 kg (10/02 1647)  Intake/Output from previous day:  Intake/Output Summary (Last 24 hours) at 11/02/2021 1150 Last data filed at 11/02/2021 0853 Gross per 24 hour  Intake 4865.49 ml  Output 3500 ml  Net 1365.49 ml     Intake/Output this shift: Total I/O In: 120 [P.O.:120] Out: 200 [Urine:200]  Labs: Recent Labs    11/02/21 0326  HGB 11.4*   Recent Labs    11/02/21 0326  WBC 11.7*  RBC 4.08  HCT 36.2  PLT 206   Recent Labs    11/02/21 0326  NA 139  K 3.8  CL 108  CO2 24  BUN 19  CREATININE 0.86  GLUCOSE 140*  CALCIUM 8.8*   No results for input(s): "LABPT", "INR" in the last 72 hours.  Exam: General - Patient is Alert and Oriented Extremity - Neurologically intact Neurovascular intact Sensation intact distally Dorsiflexion/Plantar flexion intact Dressing - dressing C/D/I Motor Function - intact, moving foot and toes well on exam.   Past Medical History:  Diagnosis Date   Anxiety    Arthritis    GERD (gastroesophageal reflux disease)    Hypertension    Neuromuscular disorder (HCC)    sciatica   Situs inversus     Assessment/Plan: 1 Day Post-Op Procedure(s) (LRB): TOTAL KNEE ARTHROPLASTY (Right) Principal Problem:   OA (osteoarthritis) of knee Active Problems:   Osteoarthritis of right  knee  Estimated body mass index is 32.96 kg/m as calculated from the following:   Height as of this encounter: '5\' 4"'$  (1.626 m).   Weight as of this encounter: 87.1 kg. Advance diet Up with therapy D/C IV fluids   Patient's anticipated LOS is less than 2 midnights, meeting these requirements: - Lives within 1 hour of care - Has a competent adult at home to recover with post-op recover - NO history of  - Diabetes  - Coronary Artery Disease  - Heart failure  - Heart attack  - Stroke  - DVT/VTE  - Cardiac arrhythmia  - Respiratory Failure/COPD  - Renal failure  - Anemia  - Advanced Liver disease   DVT Prophylaxis - Aspirin Weight bearing as tolerated. Continue therapy.  Plan is to go Home after hospital stay. Plan for discharge later today if progresses with therapy and meeting goals. Scheduled for OPPT at [facility]. Follow-up in the office in 2 weeks.  The PDMP database was reviewed today prior to any opioid medications being prescribed to this patient.  Theresa Duty, PA-C Orthopedic Surgery [phone] 11/02/2021, 11:50 AM

## 2021-11-02 NOTE — Progress Notes (Signed)
Physical Therapy Treatment Patient Details Name: Hailey Bates MRN: [id number] DOB: 06-15-1955 Today's Date: 11/02/2021   History of Present Illness Pt is a 66yo female presenting s/p R-TKA on 11/01/21. PMH: GERD, HTN, chronic pain syndrome, inflammatory neuropathy    PT Comments    POD # 1 pm session Assisted with amb to and from bathroom as well as in hallway an increased functional distance of 38 feet.  Practiced stairs. General stair comments: 50% VC's on proper sequencing as well as safety holding B UE's on ONE RIGHT rail. Addressed all mobility questions, discussed appropriate activity, educated on use of ICE.  Pt ready for D/C to home.    Recommendations for follow up therapy are one component of a multi-disciplinary discharge planning process, led by the attending physician.  Recommendations may be updated based on patient status, additional functional criteria and insurance authorization.  Follow Up Recommendations  Follow physician's recommendations for discharge plan and follow up therapies     Assistance Recommended at Discharge Frequent or constant Supervision/Assistance  Patient can return home with the following A little help with walking and/or transfers;A little help with bathing/dressing/bathroom;Assistance with cooking/housework;Assist for transportation;Help with stairs or ramp for entrance   Equipment Recommendations  None recommended by PT    Recommendations for Other Services       Precautions / Restrictions Precautions Precautions: Fall Precaution Comments: no pillow under knee Restrictions Weight Bearing Restrictions: No RLE Weight Bearing: Weight bearing as tolerated     Mobility  Bed Mobility   General bed mobility comments: OOB in recliner    Transfers Overall transfer level: Needs assistance Equipment used: Rolling walker (2 wheels) Transfers: Sit to/from Stand Sit to Stand: Supervision, Min guard           General transfer comment:  25% VC's on proper hand placement as well as safetywith turns.    Ambulation/Gait Ambulation/Gait assistance: Supervision, Min guard Gait Distance (Feet): 38 Feet Assistive device: Rolling walker (2 wheels) Gait Pattern/deviations: Step-to pattern, Decreased stance time - right Gait velocity: decreased     General Gait Details: Pt tolerated an increased functional distance.   Stairs Stairs: Yes Stairs assistance: Supervision, Min guard Stair Management: One rail Right, Step to pattern, Sideways, Forwards Number of Stairs: 4 General stair comments: 50% VC's on proper sequencing as well as safety holding B UE's on ONE RIGHT rail.   Wheelchair Mobility    Modified Rankin (Stroke Patients Only)       Balance                                            Cognition Arousal/Alertness: Awake/alert Behavior During Therapy: WFL for tasks assessed/performed Overall Cognitive Status: Within Functional Limits for tasks assessed                                 General Comments: AxO x 3 very pleasant Lady who plans to D/C to home with help from daughter but also Private Aide 4 hours a day.        Exercises      General Comments        Pertinent Vitals/Pain Pain Assessment Pain Assessment: 0-10 Pain Score: 7  Pain Location: right knee Pain Descriptors / Indicators: Burning, Operative site guarding, Tender, Tightness Pain Intervention(s): Monitored during session, Premedicated before  session, Repositioned, Ice applied    Home Living                          Prior Function            PT Goals (current goals can now be found in the care plan section) Progress towards PT goals: Progressing toward goals    Frequency    7X/week      PT Plan Current plan remains appropriate    Co-evaluation              AM-PAC PT "6 Clicks" Mobility   Outcome Measure  Help needed turning from your back to your side while in a  flat bed without using bedrails?: A Little Help needed moving from lying on your back to sitting on the side of a flat bed without using bedrails?: A Little Help needed moving to and from a bed to a chair (including a wheelchair)?: A Little Help needed standing up from a chair using your arms (e.g., wheelchair or bedside chair)?: A Little Help needed to walk in hospital room?: A Little Help needed climbing 3-5 steps with a railing? : A Little 6 Click Score: 18    End of Session Equipment Utilized During Treatment: Gait belt Activity Tolerance: Patient tolerated treatment well;No increased pain Patient left: in chair;with call bell/phone within reach;with chair alarm set Nurse Communication: Mobility status PT Visit Diagnosis: Pain;Difficulty in walking, not elsewhere classified (R26.2) Pain - Right/Left: Right Pain - part of body: Knee     Time: 1410-1434 PT Time Calculation (min) (ACUTE ONLY): 24 min  Charges:  $Gait Training: 8-22 mins $Therapeutic Activity: 8-22 mins                     Rica Koyanagi  PTA [place] [facility] M-F          [phone] Weekend pager [phone]

## 2021-11-02 NOTE — Progress Notes (Signed)
Physical Therapy Treatment Patient Details Name: Hailey Bates MRN: [id number] DOB: [birth year] Today's Date: 11/02/2021   History of Present Illness Pt is a 66yo female presenting s/p R-TKA on 11/01/21. PMH: GERD, HTN, chronic pain syndrome, inflammatory neuropathy    PT Comments    POD # 1 am session General Comments: AxO x 3 very pleasant Lady who plans to D/C to home with help from daughter but also Private Aide 4 hours a day. Assisted OOB.  General bed mobility comments: demonstarted and instructed how to use a belt to self assist LE.  General transfer comment: 25% VC's on proper hand placement as well as safetywith turns. General Gait Details: Pt ambulated with RW and min guard, no physical assist rqeuired or overt LOB noted. Pt tentative and fearful but gait improved with increased distance.    Recommendations for follow up therapy are one component of a multi-disciplinary discharge planning process, led by the attending physician.  Recommendations may be updated based on patient status, additional functional criteria and insurance authorization.  Follow Up Recommendations  Follow physician's recommendations for discharge plan and follow up therapies     Assistance Recommended at Discharge Frequent or constant Supervision/Assistance  Patient can return home with the following A little help with walking and/or transfers;A little help with bathing/dressing/bathroom;Assistance with cooking/housework;Assist for transportation;Help with stairs or ramp for entrance   Equipment Recommendations  None recommended by PT    Recommendations for Other Services       Precautions / Restrictions Precautions Precaution Comments: no pillow under knee Restrictions Weight Bearing Restrictions: No RLE Weight Bearing: Weight bearing as tolerated     Mobility  Bed Mobility Overal bed mobility: Needs Assistance Bed Mobility: Supine to Sit     Supine to sit: Min guard     General bed  mobility comments: demonstarted and instructed how to use a belt to self assist LE    Transfers Overall transfer level: Needs assistance Equipment used: Rolling walker (2 wheels) Transfers: Sit to/from Stand Sit to Stand: Supervision, Min guard           General transfer comment: 25% VC's on proper hand placement as well as safetywith turns.    Ambulation/Gait Ambulation/Gait assistance: Supervision, Min guard Gait Distance (Feet): 9 Feet Assistive device: Rolling walker (2 wheels) Gait Pattern/deviations: Step-to pattern, Decreased stance time - right Gait velocity: decreased     General Gait Details: Pt ambulated with RW and min guard, no physical assist rqeuired or overt LOB noted. Pt tentative and fearful but gait improved with increased distance.   Stairs             Wheelchair Mobility    Modified Rankin (Stroke Patients Only)       Balance                                            Cognition Arousal/Alertness: Awake/alert Behavior During Therapy: WFL for tasks assessed/performed Overall Cognitive Status: Within Functional Limits for tasks assessed                                 General Comments: AxO x 3 very pleasant Lady who plans to D/C to home with help from daughter but also Private Aide 4 hours a day.        Exercises  Total Knee Replacement TE's following HEP handout 10 reps B LE ankle pumps 05 reps towel squeezes 05 reps knee presses 05 reps heel slides  05 reps SAQ's 05 reps SLR's 05 reps ABD Educated on use of gait belt to assist with TE's Followed by ICE     General Comments        Pertinent Vitals/Pain Pain Assessment Pain Assessment: 0-10 Pain Score: 6  Pain Location: right knee Pain Descriptors / Indicators: Burning, Operative site guarding, Tender, Tightness Pain Intervention(s): Monitored during session, Premedicated before session, Repositioned    Home Living                           Prior Function            PT Goals (current goals can now be found in the care plan section) Progress towards PT goals: Progressing toward goals    Frequency    7X/week      PT Plan Current plan remains appropriate    Co-evaluation              AM-PAC PT "6 Clicks" Mobility   Outcome Measure  Help needed turning from your back to your side while in a flat bed without using bedrails?: A Little Help needed moving from lying on your back to sitting on the side of a flat bed without using bedrails?: A Little Help needed moving to and from a bed to a chair (including a wheelchair)?: A Little Help needed standing up from a chair using your arms (e.g., wheelchair or bedside chair)?: A Little Help needed to walk in hospital room?: A Little Help needed climbing 3-5 steps with a railing? : A Little 6 Click Score: 18    End of Session Equipment Utilized During Treatment: Gait belt Activity Tolerance: Patient tolerated treatment well;No increased pain Patient left: in chair;with call bell/phone within reach;with chair alarm set;with SCD's reapplied Nurse Communication: Mobility status PT Visit Diagnosis: Pain;Difficulty in walking, not elsewhere classified (R26.2) Pain - Right/Left: Right Pain - part of body: Knee     Time: 4709-6283 PT Time Calculation (min) (ACUTE ONLY): 26 min  Charges:  $Gait Training: 8-22 mins $Therapeutic Exercise: 8-22 mins                     Rica Koyanagi  PTA [place] [facility] M-F          [phone] Weekend pager [phone]

## 2021-11-03 NOTE — Discharge Summary (Signed)
Patient ID: Hailey Bates MRN: [id number] DOB/AGE: 03-Aug-1955 66 y.o.  Admit date: 11/01/2021 Discharge date: 11/02/2021  Admission Diagnoses:  Principal Problem:   OA (osteoarthritis) of knee Active Problems:   Osteoarthritis of right knee   Discharge Diagnoses:  Same  Past Medical History:  Diagnosis Date   Anxiety    Arthritis    GERD (gastroesophageal reflux disease)    Hypertension    Neuromuscular disorder ([place])    sciatica   Situs inversus     Surgeries: Procedure(s): TOTAL KNEE ARTHROPLASTY on 11/01/2021   Consultants:   Discharged Condition: Improved  Hospital Course: Hailey Bates is an 66 y.o. female who was admitted 11/01/2021 for operative treatment ofOA (osteoarthritis) of knee. Patient has severe unremitting pain that affects sleep, daily activities, and work/hobbies. After pre-op clearance the patient was taken to the operating room on 11/01/2021 and underwent  Procedure(s): TOTAL KNEE ARTHROPLASTY.    Patient was given perioperative antibiotics:  Anti-infectives (From admission, onward)    Start     Dose/Rate Route Frequency Ordered Stop   11/01/21 1900  ceFAZolin (ANCEF) IVPB 2g/100 mL premix        2 g 200 mL/hr over 30 Minutes Intravenous Every 6 hours 11/01/21 1643 11/02/21 0155   11/01/21 1015  ceFAZolin (ANCEF) IVPB 2g/100 mL premix        2 g 200 mL/hr over 30 Minutes Intravenous On call to O.R. 11/01/21 1004 11/01/21 1335        Patient was given sequential compression devices, early ambulation, and chemoprophylaxis to prevent DVT.  Patient benefited maximally from hospital stay and there were no complications.    Recent vital signs: Patient Vitals for the past 24 hrs:  BP Temp Pulse Resp SpO2  11/02/21 1504 113/78 97.7 F (36.5 C) 89 18 98 %     Recent laboratory studies:  Recent Labs    11/02/21 0326  WBC 11.7*  HGB 11.4*  HCT 36.2  PLT 206  NA 139  K 3.8  CL 108  CO2 24  BUN 19  CREATININE 0.86  GLUCOSE 140*   CALCIUM 8.8*     Discharge Medications:   Allergies as of 11/02/2021   No Known Allergies      Medication List     STOP taking these medications    Ibuprofen 200 MG Caps   oxyCODONE-acetaminophen 10-325 MG tablet Commonly known as: PERCOCET   SALONPAS PAIN RELIEF PATCH EX       TAKE these medications    amLODipine-olmesartan 5-20 MG tablet Commonly known as: AZOR Take 1 tablet by mouth daily.   aspirin EC 325 MG tablet Take 1 tablet (325 mg total) by mouth 2 (two) times daily for 20 days. Then take one 81 mg aspirin once a day for three weeks. Then discontinue aspirin.   CALCIUM + D3 PO Take 2 tablets by mouth daily.   cyclobenzaprine 10 MG tablet Commonly known as: FLEXERIL Take 10 mg by mouth daily as needed for muscle spasms.   escitalopram 10 MG tablet Commonly known as: LEXAPRO Take 10 mg by mouth 3 (three) times a week.   famotidine 20 MG tablet Commonly known as: PEPCID Take 20 mg by mouth daily.   gabapentin 300 MG capsule Commonly known as: NEURONTIN TAKE TWO CAPSULES THREE TIMES DAILY (MAY TAKE 1 EXTRA IF NEEDED)   HYDROmorphone 2 MG tablet Commonly known as: DILAUDID Take 1-2 tablets (2-4 mg total) by mouth every 6 (six) hours as needed for severe  pain.   methylPREDNISolone sodium succinate 1000 MG injection Commonly known as: SOLU-MEDROL Inject 1,000 mg into the vein once. PT takes infusion once per month per pt per DR Posey Pronto- Neurology.   traMADol 50 MG tablet Commonly known as: ULTRAM Take 1-2 tablets (50-100 mg total) by mouth every 6 (six) hours as needed for moderate pain.   zolpidem 10 MG tablet Commonly known as: AMBIEN Take 10 mg by mouth at bedtime.               Discharge Care Instructions  (From admission, onward)           Start     Ordered   11/02/21 0000  Weight bearing as tolerated        11/02/21 1154   11/02/21 0000  Change dressing       Comments: You may remove the bulky bandage (ACE wrap and gauze)  two days after surgery. You will have an adhesive waterproof bandage underneath. Leave this in place until your first follow-up appointment.   11/02/21 1154            Diagnostic Studies: No results found.  Disposition: Discharge disposition: 01-Home or Self Care       Discharge Instructions     Call MD / Call 911   Complete by: As directed    If you experience chest pain or shortness of breath, CALL 911 and be transported to the hospital emergency room.  If you develope a fever above 101 F, pus (white drainage) or increased drainage or redness at the wound, or calf pain, call your surgeon's office.   Change dressing   Complete by: As directed    You may remove the bulky bandage (ACE wrap and gauze) two days after surgery. You will have an adhesive waterproof bandage underneath. Leave this in place until your first follow-up appointment.   Constipation Prevention   Complete by: As directed    Drink plenty of fluids.  Prune juice may be helpful.  You may use a stool softener, such as Colace (over the counter) 100 mg twice a day.  Use MiraLax (over the counter) for constipation as needed.   Diet - low sodium heart healthy   Complete by: As directed    Do not put a pillow under the knee. Place it under the heel.   Complete by: As directed    Driving restrictions   Complete by: As directed    No driving for two weeks   Post-operative opioid taper instructions:   Complete by: As directed    POST-OPERATIVE OPIOID TAPER INSTRUCTIONS: It is important to wean off of your opioid medication as soon as possible. If you do not need pain medication after your surgery it is ok to stop day one. Opioids include: Codeine, Hydrocodone(Norco, Vicodin), Oxycodone(Percocet, oxycontin) and hydromorphone amongst others.  Long term and even short term use of opiods can cause: Increased pain response Dependence Constipation Depression Respiratory depression And more.  Withdrawal symptoms can  include Flu like symptoms Nausea, vomiting And more Techniques to manage these symptoms Hydrate well Eat regular healthy meals Stay active Use relaxation techniques(deep breathing, meditating, yoga) Do Not substitute Alcohol to help with tapering If you have been on opioids for less than two weeks and do not have pain than it is ok to stop all together.  Plan to wean off of opioids This plan should start within one week post op of your joint replacement. Maintain the same interval or time  between taking each dose and first decrease the dose.  Cut the total daily intake of opioids by one tablet each day Next start to increase the time between doses. The last dose that should be eliminated is the evening dose.      TED hose   Complete by: As directed    Use stockings (TED hose) for three weeks on both leg(s).  You may remove them at night for sleeping.   Weight bearing as tolerated   Complete by: As directed         Follow-up Information     Aluisio, Pilar Plate, MD. Schedule an appointment as soon as possible for a visit in 2 week(s).   Specialty: Orthopedic Surgery Contact information: [address] [place] [address] [phone]                  Signed: Theresa Duty 11/03/2021, 11:08 AM

## 2021-11-30 ENCOUNTER — Other Ambulatory Visit: Payer: Self-pay

## 2021-11-30 ENCOUNTER — Other Ambulatory Visit: Payer: Self-pay | Admitting: Neurology

## 2021-12-07 ENCOUNTER — Encounter (HOSPITAL_COMMUNITY)
Admission: RE | Admit: 2021-12-07 | Discharge: 2021-12-07 | Disposition: A | Discharge status: Home / Self Care | Payer: Medicare PPO | Source: Ambulatory Visit | Attending: Neurology | Admitting: Neurology

## 2021-12-07 VITALS — BP 139/83 | HR 81 | Temp 98.3°F | Resp 20

## 2021-12-07 DIAGNOSIS — G619 Inflammatory polyneuropathy, unspecified: Secondary | ICD-10-CM | POA: Diagnosis not present

## 2021-12-07 MED ORDER — SODIUM CHLORIDE 0.9 % IV SOLN
1000.0000 mg | Freq: Once | INTRAVENOUS | Status: AC
  Administered 2021-12-07: 1000 mg via INTRAVENOUS
  Filled 2021-12-07: qty 16

## 2021-12-07 NOTE — Progress Notes (Signed)
Diagnosis: Inflammatory Neuropathy  Provider:  Narda Amber DO  Procedure: Infusion  IV Type: Peripheral, IV Location: L Antecubital  Solumedrol (Methylprednisolone), Dose: 1000 mg  Infusion Start Time: 3291  Infusion Stop Time: 9166  Post Infusion IV Care: Patient declined observation and Peripheral IV Discontinued  Discharge: Condition: Good, Destination: Home . AVS provided to patient.   Performed by:  Jonelle Sidle, RN

## 2022-01-04 ENCOUNTER — Encounter (HOSPITAL_COMMUNITY): Payer: Medicare PPO

## 2022-02-01 ENCOUNTER — Encounter (HOSPITAL_COMMUNITY): Payer: Medicare PPO

## 2022-03-14 ENCOUNTER — Encounter: Payer: Self-pay | Admitting: Neurology

## 2022-03-14 ENCOUNTER — Ambulatory Visit: Payer: Medicare PPO | Admitting: Neurology

## 2022-03-14 VITALS — BP 144/85 | HR 103 | Ht 64.0 in | Wt 205.0 lb

## 2022-03-14 DIAGNOSIS — G619 Inflammatory polyneuropathy, unspecified: Secondary | ICD-10-CM

## 2022-03-14 NOTE — Patient Instructions (Addendum)
It was lovely to see you today!  Continue to monitor symptoms off steroids  I will see you back in 4 months

## 2022-03-14 NOTE — Progress Notes (Signed)
Follow-up Visit   Date: 03/14/22   Hailey Bates MRN: [id number] DOB: 11-06-1955   Interim History: Hailey Bates is a 67 y.o. 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  She was previously seen in April 2022 for left arm weakness.  The patient was accompanied to the clinic by self.  History of present illness: In September 2021, she woke up in the middle of the night with left proximal arm throbbing pain.  She was given steroid injection for possible muscle strain. Because of unrelenting pain, she was given 5-days of prednisone which helped her pain when taking the steroids, but then returned once she stopped it.  She was seen by orthopeadics who felt she may have high nerve entrapment. She completed physical therapy which did not help her symptoms.  By November 2021, she was having tingling and sharp pain over upper lateral arm, forearm, and dorsum of the thumb, index finger, and middle finger.  She has some improvement in pain with gabapentin 317m three times daily.  She has weakness in the left hand and arm. MRI cervical spine shows cervical spondylosis with neural foraminal stenosis Bates C5-6 bilaterally, which would not explain symptoms, so she was referred for EMG.  EDX from April 2022 showed sensorimotor polyneuropathy affecting both arms. Of note, since February, she feels that her pain and weakness has stabilized and there has been no further progression.   Her brother and mother had burning pain in the feet, neither are diabetic.  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  [place]  No history of diabetes or heavy alcohol use.   UPDATE 11/25/2020: She is accompanied by her cousin, Hailey Bates her last visit in April, I recommended CSF testing,  however, patient tells me that her left arm weakness started to improve, so she decided not to proceed with testing.  She completed OT and had complete return of left arm strength.  She was doing well until 09/25/2020, when she slipped off her bed and hit her right arm against the nightstand. About two days later, she began having excruciating pain, described as achy/throbbing involving the upper arm and forearm.  No associated tingling or numbness or weakness Bates onset.  About three weeks later, she began having weakness in the right hand, where she was unable to open or make a fist on the right hand.  She has tried ibuprofen and tylenol which helps her pain.  She continues to have ongoing right hand weakness and entire arm pain.  She is unable to use the hand.  She was referred for EMG by Dr. GVanetta Shawloffice which shows asymmetric sensorimotor polyneuropathy, with axonal and demyelinating features, affecting the upper extremities (worse on the right), which has progressed compared to study on 04/29/2020.  She is here for further evaluation.  UPDATE 12/30/2020:  She is here for follow-up visit to discuss the results of her CSF testing.  CSF protein and IgG index was normal, however there were 4 oligoclonal bands in the CSF which was not present in the serum.  Clinically, there has been no significant improvement or worsening of right hand weakness, she is still not able to use the hand Bates all.  Over the past 1-2 weeks, she has new right knee swelling and pain, especially if  she stands on it for prolonged period of time.  She continues to have mild swelling of the right hand and fingers. Since increasing gabapentin to 670m TID, her pain has significantly improved. MRI brain was normal.  UPDATE 02/16/2021:  She is here for follow-up visit. She was given a trial of solumedrol 1g x5 days in early December.  She feels much better after taking steroids, especially with more energy and improved mood.  She has noticed  trace improvement in hand weakness.  She is able to flex the fingers slightly more.  The hand is still essentially nonfunctional. No worsening symptoms or new neurological complaints.   UPDATE 09/10/2021:  She has noticed some improvement in the right hand with pain and strength Her pain is improved and now she takes gabapentin 6062mBID.  She is able to hold items in the right hand better, such as holding a hair dryer.  She still has no movement with trying to extend the fingers or wrist, but flexion is better.  She has been tolerating Solumedrol infusions, and asks if she should get bone density evaluation.   UPDATE 03/14/2022:  She is here for follow-up.  Last infusion was in November and she eye and facial swelling.  She has not noticed any marked improvement since her last few months.  She also denies any new numbness/tingling or weakness.  Pain is controlled on gabapentin 60069mID.    Medications:  Current Outpatient Medications on File Prior to Visit  Medication Sig Dispense Refill   amLODipine-olmesartan (AZOR) 5-20 MG tablet Take 1 tablet by mouth daily.     Calcium Carb-Cholecalciferol (CALCIUM + D3 PO) Take 2 tablets by mouth daily.     cyclobenzaprine (FLEXERIL) 10 MG tablet Take 10 mg by mouth daily as needed for muscle spasms.     escitalopram (LEXAPRO) 10 MG tablet Take 10 mg by mouth 3 (three) times a week.     famotidine (PEPCID) 20 MG tablet Take 20 mg by mouth daily.     gabapentin (NEURONTIN) 300 MG capsule TAKE TWO CAPSULES TWICE TIMES DAILY (MAY TAKE 1 EXTRA IF NEEDED) 360 capsule 3   zolpidem (AMBIEN) 10 MG tablet Take 10 mg by mouth Bates bedtime.     HYDROmorphone (DILAUDID) 2 MG tablet Take 1-2 tablets (2-4 mg total) by mouth every 6 (six) hours as needed for severe pain. (Patient not taking: Reported on 03/14/2022) 42 tablet 0   methylPREDNISolone sodium succinate (SOLU-MEDROL) 1000 MG injection Inject 1,000 mg into the vein once. PT takes infusion once per month per pt per DR  [facility]. (Patient not taking: Reported on 03/14/2022)     traMADol (ULTRAM) 50 MG tablet Take 1-2 tablets (50-100 mg total) by mouth every 6 (six) hours as needed for moderate pain. (Patient not taking: Reported on 03/14/2022) 40 tablet 0   No current facility-administered medications on file prior to visit.    Allergies: No Known Allergies  Vital Signs:  BP (!) 144/85   Pulse (!) 103   Ht 5' 4"$  (1.626 m)   Wt 205 lb (93 kg)   SpO2 98%   BMI 35.19 kg/m   Neurological Exam: MENTAL STATUS including orientation to time, place, person, recent and remote memory, attention span and concentration, language, and fund of knowledge is normal.  Speech is not dysarthric.  CRANIAL NERVES:   Normal conjugate, extra-ocular eye movements in all directions of gaze.  No ptosis.     MOTOR:  Moderate right hand and forearm  atrophy. No fasciculations or abnormal movements.  No pronator drift.   Upper Extremity:  Right  Left  Deltoid  5/5   5/5   Biceps  5/5   5/5   Triceps * 5/5   5/5   Wrist extensors  1/5   5/5   Wrist flexors * 5-/5   5/5   Finger extensors  1/5   5/5   Finger flexors * 4+/5   5/5   Dorsal interossei  1/5   5/5   Abductor pollicis  1/5   5/5   Tone (Ashworth scale)  0  0   Lower Extremity:  Right  Left  Hip flexors  5/5   5/5   Toe flexors  5/5   5/5   Tone (Ashworth scale)  0  0    MSRs:                                           Right        Left brachioradialis 2+  2+  biceps 2+  2+  triceps 2+  2+  patellar 2+  2+  ankle jerk 2+  2+  plantar response down  down   SENSORY: Vibration intact throughout   COORDINATION/GAIT:  Normal finger-to- nose-finger. Gait is mildly antalgic, stable, unassisted   Data: NCS/EMG of the upper extremities 11/12/2020: This is a complex study.  The electrophysiologic findings are most suggestive of an asymmetric sensorimotor polyneuropathy, with axonal and demyelinating features, affecting the upper extremities (worse on  the right), which has progressed compared to study on 04/29/2020.  Recommend further evaluation for polyradiculoneuropathy.  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  CSF testing shows 4 oligoclonal bands in the CSF, not present in the serum, indicative of intrathecal protein synthesis as seen with inflammation/immune-mediated process.  There are no signs of CSF infection.  CSF protein was normal, which argues against CIDP. She completed Solumedrol for ~6 months and has some improvement with finger and wrist flexion.  Last infusion was in November 2023 and despite not having any the past two months, there has been no new weakness or new symptoms.  I will continue to monitor off steroids for now.   2. History of left parsonage turner syndrome (2022)  PLAN/RECOMMENDATIONS:  Monitor off solumedrol, low threshold to restart Continue gabapentin 679m twice daily  Return to clinic in 4 months  Thank you for allowing me to participate in patient's care.  If I can answer any additional questions, I would be pleased to do so.    Sincerely,    Rion Catala K. PPosey Pronto DO

## 2022-03-29 ENCOUNTER — Telehealth: Payer: Self-pay | Admitting: Neurology

## 2022-03-29 NOTE — Telephone Encounter (Signed)
Pt called in stating she has decided to start doing her infusion medication again. She cannot remember the name of the medication. She needs an order to be sent in. She wants to go to [facility] infusion center.

## 2022-03-29 NOTE — Telephone Encounter (Signed)
Please send order to [facility] for methylprednisolone 1g IV every 28 days. Thanks.

## 2022-03-29 NOTE — Telephone Encounter (Signed)
Sandy from [facility] called stating the medication the patient requested is Solumedrol and the order needs to be faxed to their new fax number at [phone].

## 2022-03-31 MED ORDER — METHYLPREDNISOLONE SODIUM SUCC 1000 MG IJ SOLR: 1000.0000 mg | INTRAMUSCULAR | 11 refills | Status: DC

## 2022-03-31 NOTE — Telephone Encounter (Signed)
Rx for infusion has been faxed to [facility].

## 2022-04-05 ENCOUNTER — Encounter: Payer: Self-pay | Admitting: Neurology

## 2022-04-05 ENCOUNTER — Encounter (HOSPITAL_COMMUNITY)
Admission: RE | Admit: 2022-04-05 | Discharge: 2022-04-05 | Disposition: A | Discharge status: Home / Self Care | Payer: Medicare PPO | Source: Ambulatory Visit | Attending: Neurology | Admitting: Neurology

## 2022-04-05 VITALS — BP 104/70 | HR 83 | Temp 98.0°F | Resp 16

## 2022-04-05 DIAGNOSIS — G619 Inflammatory polyneuropathy, unspecified: Secondary | ICD-10-CM | POA: Insufficient documentation

## 2022-04-05 MED ORDER — SODIUM CHLORIDE 0.9 % IV SOLN
1000.0000 mg | Freq: Once | INTRAVENOUS | Status: AC
  Administered 2022-04-05: 1000 mg via INTRAVENOUS
  Filled 2022-04-05: qty 16

## 2022-04-05 NOTE — Addendum Note (Signed)
Encounter addended by: Baxter Hire, RN on: 04/05/2022 3:33 PM  Actions taken: Therapy plan modified

## 2022-04-05 NOTE — Progress Notes (Signed)
Diagnosis: Inflammatory Neuropathy  Provider:  Narda Amber DO  Procedure: Infusion  IV Type: Peripheral, IV Location: L Antecubital  Solumedrol (Methylprednisolone), Dose: 1000 mg  Infusion Start Time: [id number]  Infusion Stop Time: [id number]  Post Infusion IV Care: Patient declined observation  Discharge: Condition: Good, Destination: Home . AVS Provided  Performed by:  Kennith Maes, RN

## 2022-04-18 ENCOUNTER — Telehealth: Payer: Self-pay | Admitting: Anesthesiology

## 2022-04-18 ENCOUNTER — Encounter: Payer: Self-pay | Admitting: Neurology

## 2022-04-18 NOTE — Telephone Encounter (Signed)
Called patient and informed her of Dr. Serita Grit advice and recommendations. Patient stated that the pain is worse in the mornings when she first gets up. Patient would like to be scheduled for the appointment on Wednesday at 10:10 and is aware I will let the front know to schedule her. Patient verbalized understanding of everything that was explained to her and had no further questions or concerns.

## 2022-04-18 NOTE — Telephone Encounter (Signed)
This is a new complaint, so she would need to be seen in the office.  I can see her Wednesday morning at 10:10a.  However, given the severity of her pain, she may want to contact her PCP or go to urgent care to be evaluated today.

## 2022-04-18 NOTE — Telephone Encounter (Signed)
Pt called stating she is having severe pain on right hip that radiates all the way down to her foot. States it is difficult to get up from a sitting position due to the pain. Requests call back.

## 2022-04-20 ENCOUNTER — Ambulatory Visit: Payer: Medicare PPO | Admitting: Neurology

## 2022-04-20 ENCOUNTER — Encounter: Payer: Self-pay | Admitting: Neurology

## 2022-04-20 VITALS — BP 142/82 | HR 103 | Resp 18 | Ht 64.0 in | Wt 205.0 lb

## 2022-04-20 DIAGNOSIS — M5416 Radiculopathy, lumbar region: Secondary | ICD-10-CM

## 2022-04-20 DIAGNOSIS — G619 Inflammatory polyneuropathy, unspecified: Secondary | ICD-10-CM

## 2022-04-20 NOTE — Progress Notes (Signed)
Follow-up Visit   Date: 04/20/22   Hailey Bates MRN: [id number] DOB: November 08, 1955   Interim History: TEMAKA OSU is a 67 y.o. right-handed Caucasian female with hypertension and insomnia returning to the clinic with new complaints of low back and right leg pain.  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  The patient was accompanied to the clinic by cousin.  IMPRESSION/PLAN:  Probable lumbar radiculopathy causing low back pain and right leg paresthesias - Start PT for low back strengthening - Continue flexeril 10mg  daily prn - She is also taking gabapentin 600mg  BID for neuropathy, which may help - If no improvement going forward, MRI lumbar spine would be indicated  2.  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  CSF testing shows 4 oligoclonal bands in the CSF, not present in the serum, indicative of intrathecal protein synthesis as seen with inflammation/immune-mediated process.  CSF protein was normal, which argues against CIDP. She completed Solumedrol for ~6 months and has some improvement with finger and wrist flexion.   - Continue Solumedrol 1mg  every 28 days  Return to clinic in 3 months, or sooner as needed  ------------------------------------------------- UPDATE 04/20/2022:  for the past two weeks, she has stiffness in the low back with shooting pain down the right side of her thigh, lower leg, and top of the foot.  She has similar symptoms to a lesser degree in the left leg.  She takes a oxycodone and flexeril 10mg  (prescribed by PCP), which provides relief within 30-60 min and allows her to get out of the bed after few minutes.  She has less intense pain and tingling during the day.  She has tenderness over the right  buttocks region.    Medications:  Current Outpatient Medications on File Prior to Visit  Medication Sig Dispense Refill   amLODipine-olmesartan (AZOR) 5-20 MG tablet Take 1 tablet by mouth daily.     Calcium Carb-Cholecalciferol (CALCIUM + D3 PO) Take 2 tablets by mouth daily.     cyclobenzaprine (FLEXERIL) 10 MG tablet Take 10 mg by mouth daily as needed for muscle spasms.     escitalopram (LEXAPRO) 10 MG tablet Take 10 mg by mouth 3 (three) times a week.     famotidine (PEPCID) 20 MG tablet Take 20 mg by mouth daily.     gabapentin (NEURONTIN) 300 MG capsule TAKE TWO CAPSULES TWICE TIMES DAILY (MAY TAKE 1 EXTRA IF NEEDED) 360 capsule 3   HYDROmorphone (DILAUDID) 2 MG tablet Take 1-2 tablets (2-4 mg total) by mouth every 6 (six) hours as needed for severe pain. 42 tablet 0   methylPREDNISolone sodium succinate (SOLU-MEDROL) 1000 MG injection Inject 1,000 mg into the vein every 28 (twenty-eight) days. Methylprednisolone 1g IV every 28 days at [facility] 1 each 11   traMADol (ULTRAM) 50 MG tablet Take 1-2 tablets (50-100 mg total) by mouth every 6 (six) hours as needed for moderate pain. 40 tablet 0   zolpidem (AMBIEN) 10 MG tablet Take 10 mg by mouth at bedtime.     No current facility-administered medications on file prior to visit.    Allergies: No Known Allergies  Vital Signs:  BP (!) 142/82   Pulse (!) 103   Resp 18   Ht 5\' 4"  (1.626 m)   Wt 205 lb (93 kg)   SpO2  96%   BMI 35.19 kg/m   Neurological Exam: MENTAL STATUS including orientation to time, place, person, recent and remote memory, attention span and concentration, language, and fund of knowledge is normal.  Speech is not dysarthric.  CRANIAL NERVES:   Normal conjugate, extra-ocular eye movements in all directions of gaze.  No ptosis.     MOTOR:  Moderate right hand and forearm atrophy. No fasciculations or abnormal movements.  No pronator drift.   Upper Extremity:  Right  Left  Deltoid  5/5   5/5   Biceps  5/5    5/5   Triceps  5/5   5/5   Wrist extensors  1/5   5/5   Wrist flexors 5-/5   5/5   Finger extensors  1/5   5/5   Finger flexors  4+/5   5/5   Dorsal interossei  1/5   5/5   Abductor pollicis  1/5   5/5   Tone (Ashworth scale)  0  0   Lower Extremity:  Right  Left  Hip flexors  5/5   5/5   Knee flexors  5/5   5/5   Knee extensors  5/5   5/5   Dorsiflexors  5/5   5/5   Plantarflexors  5/5   5/5   Toe extensors  5/5   5/5   Toe flexors  5/5   5/5   Tone (Ashworth scale)  0  0   MSRs:                                           Right        Left brachioradialis 2+  2+  biceps 2+  2+  triceps 2+  2+  patellar 1+  1+  ankle jerk 2+  2+  plantar response down  down   SENSORY: Vibration intact throughout   COORDINATION/GAIT:  Normal finger-to- nose-finger. Gait is normal, unassisted and stable.   Data: NCS/EMG of the upper extremities 11/12/2020: This is a complex study.  The electrophysiologic findings are most suggestive of an asymmetric sensorimotor polyneuropathy, with axonal and demyelinating features, affecting the upper extremities (worse on the right), which has progressed compared to study on 04/29/2020.  Recommend further evaluation for polyradiculoneuropathy.  MRI brain wo contrast 12/08/2020: Normal  CSF 12/15/2020:  R0  W1  P43  G39*  Cytology neg, MBP <2, ACE 8, IgG index 0.48, OCB - 4 bands in CSF not present in serum  Labs 12/30/2020: ESR 57*, CRP <1, RF neg, ANA neg, SSA/B neg, cryoglobulins neg, c/p ANCA neg, anti-MAG neg, heavy metal panel neg    Thank you for allowing me to participate in patient's care.  If I can answer any additional questions, I would be pleased to do so.    Sincerely,    Sunni Richardson K. Posey Pronto, DO

## 2022-04-20 NOTE — Patient Instructions (Signed)
Start physical therapy.  If your symptoms have not improved with 6 sessions of PT, please call my office and we can order MRI lumbar spine.

## 2022-05-02 ENCOUNTER — Encounter (HOSPITAL_COMMUNITY): Payer: Medicare PPO

## 2022-05-03 ENCOUNTER — Encounter (HOSPITAL_COMMUNITY)
Admission: RE | Admit: 2022-05-03 | Discharge: 2022-05-03 | Disposition: A | Discharge status: Home / Self Care | Payer: Medicare PPO | Source: Ambulatory Visit | Attending: Neurology | Admitting: Neurology

## 2022-05-03 VITALS — BP 107/70 | HR 81 | Temp 97.7°F | Resp 16

## 2022-05-03 DIAGNOSIS — G619 Inflammatory polyneuropathy, unspecified: Secondary | ICD-10-CM

## 2022-05-03 MED ORDER — SODIUM CHLORIDE 0.9 % IV SOLN
1000.0000 mg | Freq: Once | INTRAVENOUS | Status: AC
  Administered 2022-05-03: 1000 mg via INTRAVENOUS
  Filled 2022-05-03: qty 16

## 2022-05-03 NOTE — Progress Notes (Signed)
Diagnosis: Inflammatory Neuropathy  Provider:  Narda Amber DO  Procedure: Infusion  IV Type: Peripheral, IV Location: R Antecubital  Solumedrol (Methylprednisolone), Dose: 1000 mg  Infusion Start Time: [id number]  Infusion Stop Time: [id number]  Post Infusion IV Care: Patient declined observation and Peripheral IV Discontinued  Discharge: Condition: Good, Destination: Home . AVS Provided  Performed by:  Baxter Hire, RN

## 2022-05-11 ENCOUNTER — Other Ambulatory Visit: Payer: Self-pay | Admitting: Pharmacy Technician

## 2022-05-30 ENCOUNTER — Encounter (HOSPITAL_COMMUNITY): Payer: Medicare PPO

## 2022-05-31 ENCOUNTER — Encounter (HOSPITAL_COMMUNITY): Admission: RE | Admit: 2022-05-31 | Payer: Medicare PPO | Source: Ambulatory Visit

## 2022-06-10 ENCOUNTER — Encounter (HOSPITAL_COMMUNITY)
Admission: RE | Admit: 2022-06-10 | Discharge: 2022-06-10 | Disposition: A | Discharge status: Home / Self Care | Payer: Medicare PPO | Source: Ambulatory Visit | Attending: Neurology | Admitting: Neurology

## 2022-06-10 DIAGNOSIS — G619 Inflammatory polyneuropathy, unspecified: Secondary | ICD-10-CM | POA: Insufficient documentation

## 2022-06-13 ENCOUNTER — Encounter (HOSPITAL_COMMUNITY)
Admission: RE | Admit: 2022-06-13 | Discharge: 2022-06-13 | Disposition: A | Discharge status: Home / Self Care | Payer: Medicare PPO | Source: Ambulatory Visit | Attending: Neurology | Admitting: Neurology

## 2022-06-13 VITALS — BP 94/66 | HR 76 | Temp 97.8°F | Resp 20

## 2022-06-13 DIAGNOSIS — G619 Inflammatory polyneuropathy, unspecified: Secondary | ICD-10-CM | POA: Diagnosis present

## 2022-06-13 MED ORDER — SODIUM CHLORIDE 0.9 % IV SOLN
1000.0000 mg | Freq: Once | INTRAVENOUS | Status: AC
  Administered 2022-06-13: 1000 mg via INTRAVENOUS
  Filled 2022-06-13: qty 16

## 2022-06-13 NOTE — Progress Notes (Signed)
Diagnosis: Inflammatory Neuropathy  Provider:  Nita Sickle, DO  Procedure: IV Infusion  IV Type: Peripheral, IV Location: L Antecubital  Solumedrol (Methylprednisolone), Dose: 1000 mg  Infusion Start Time: 1434  Infusion Stop Time: 1535  Post Infusion IV Care: Peripheral IV Discontinued  Discharge: Condition: Good, Destination: Home . AVS Provided  Performed by:  Marlow Baars [place], RN

## 2022-06-13 NOTE — Addendum Note (Signed)
Encounter addended by: Wyvonne Lenz, RN on: 06/13/2022 4:00 PM  Actions taken: Therapy plan modified

## 2022-07-11 ENCOUNTER — Encounter (HOSPITAL_COMMUNITY)
Admission: RE | Admit: 2022-07-11 | Discharge: 2022-07-11 | Disposition: A | Discharge status: Home / Self Care | Payer: Medicare PPO | Source: Ambulatory Visit | Attending: Neurology | Admitting: Neurology

## 2022-07-11 VITALS — BP 113/70 | HR 64 | Temp 97.8°F | Resp 14

## 2022-07-11 DIAGNOSIS — G619 Inflammatory polyneuropathy, unspecified: Secondary | ICD-10-CM | POA: Diagnosis present

## 2022-07-11 MED ORDER — SODIUM CHLORIDE 0.9 % IV SOLN
1000.0000 mg | Freq: Once | INTRAVENOUS | Status: AC
  Administered 2022-07-11: 1000 mg via INTRAVENOUS
  Filled 2022-07-11: qty 16

## 2022-07-11 NOTE — Progress Notes (Signed)
Diagnosis: Inflammatory Neuropathy  Provider:  Nita Sickle DO  Procedure: IV Infusion  IV Type: Peripheral, IV Location: L Antecubital  Solumedrol (Methylprednisolone), Dose: 1000 mg  Infusion Start Time: 1426  Infusion Stop Time: 1525  Post Infusion IV Care: Patient declined observation and Peripheral IV Discontinued  Discharge: Condition: Good, Destination: Home . AVS Provided  Performed by:  Fritzi Mandes, RN

## 2022-07-11 NOTE — Addendum Note (Signed)
Encounter addended by: Wyvonne Lenz, RN on: 07/11/2022 3:50 PM  Actions taken: Therapy plan modified

## 2022-07-18 ENCOUNTER — Ambulatory Visit: Payer: Medicare PPO | Admitting: Neurology

## 2022-08-09 ENCOUNTER — Inpatient Hospital Stay (HOSPITAL_COMMUNITY): Admission: RE | Admit: 2022-08-09 | Payer: Medicare PPO | Source: Ambulatory Visit

## 2022-08-10 ENCOUNTER — Encounter (HOSPITAL_COMMUNITY)
Admission: RE | Admit: 2022-08-10 | Discharge: 2022-08-10 | Disposition: A | Discharge status: Home / Self Care | Payer: Medicare PPO | Source: Ambulatory Visit | Attending: Neurology | Admitting: Neurology

## 2022-08-10 VITALS — BP 118/75 | HR 88 | Temp 98.2°F

## 2022-08-10 DIAGNOSIS — G619 Inflammatory polyneuropathy, unspecified: Secondary | ICD-10-CM | POA: Diagnosis present

## 2022-08-10 MED ORDER — SODIUM CHLORIDE 0.9 % IV SOLN
1000.0000 mg | Freq: Once | INTRAVENOUS | Status: AC
  Administered 2022-08-10: 1000 mg via INTRAVENOUS
  Filled 2022-08-10: qty 16

## 2022-08-10 NOTE — Progress Notes (Signed)
Diagnosis: Multiple Sclerosis  Provider:  Nita Sickle DO  Procedure: IV Infusion  IV Type: Peripheral, IV Location: R Antecubital  Solumedrol (Methylprednisolone), Dose: 1000 mg  Infusion Start Time: 1435  Infusion Stop Time: 1540  Post Infusion IV Care: Observation period completed  Discharge: Condition: Good, Destination: Home . AVS Provided  Performed by:  Daleen Squibb, RN

## 2022-09-03 ENCOUNTER — Telehealth: Payer: Self-pay | Admitting: Pharmacy Technician

## 2022-09-03 NOTE — Telephone Encounter (Signed)
Auth Submission: NO AUTH NEEDED Site of care: Site of care: [facility] Payer: [employer] MEDICARE Medication & CPT/J Code(s) submitted: SOLUMEDROL Route of submission (phone, fax, portal):  Phone # Fax # Auth type: Buy/Bill PB Units/visits requested: AS NEEDED Reference number:  Approval from: 09/03/22 to 01/31/23

## 2022-09-05 ENCOUNTER — Ambulatory Visit: Payer: Medicare PPO | Admitting: Neurology

## 2022-09-05 VITALS — BP 119/80 | HR 78 | Ht 64.0 in | Wt 204.0 lb

## 2022-09-05 DIAGNOSIS — G619 Inflammatory polyneuropathy, unspecified: Secondary | ICD-10-CM | POA: Diagnosis not present

## 2022-09-05 NOTE — Progress Notes (Signed)
Follow-up Visit   Date: 09/05/22   Hailey Bates MRN: [id number] DOB: 06-Apr-1955   Interim History: Hailey Bates is a 67 y.o. 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   The patient was accompanied to the clinic by cousin.  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  CSF testing shows 4 oligoclonal bands in the CSF, not present in the serum, indicative of intrathecal protein synthesis as seen with inflammation/immune-mediated process.  CSF protein was normal, which argues against CIDP.  She reports slow improvement with Solumedrol.  Specifically, there has been no worsening or new symptoms.  - Continue Solumedrol 1mg  every 28 days, consider tapering to every 6 weeks in the future  - Continue Lyrica 100mg  twice daily (prescribed by PCP).  She was previously on gabapentin.   2.  Lumbar spondylosis with disc herniation, followed by Dr. Wynetta Emery.  She has bilateral leg paresthesias, worse on the left.  Low back pain has improved with ESI.   Return to clinic in 6 months   ------------------------------------------------- UPDATE 04/20/2022:  for the past two weeks, she has stiffness in the low back with shooting pain down the right side of her thigh, lower leg, and top of the foot.  She has similar symptoms to a lesser degree in the left leg.  She takes a oxycodone and flexeril 10mg  (prescribed by PCP), which provides relief within 30-60 min and allows her to get out of the bed after few minutes.  She has less intense pain and tingling during the day.  She has tenderness over the right buttocks region.    UPDATE 09/05/2022:  She is here for follow-up visit.  She continues to have numbness/tingling down her  legs and has been seeing Dr. Wynetta Emery at [facility].  MRI lumbar spine which shows degenerative changes with disc herniation.  She still has numbness/tingling down the left thigh, but low back pain has improved with ESI.  She feels that her right hand is stronger than before and has been able to use her hand better.  She is able to pick up things.    Medications:  Current Outpatient Medications on File Prior to Visit  Medication Sig Dispense Refill   amLODipine-olmesartan (AZOR) 5-20 MG tablet Take 1 tablet by mouth daily.     cyclobenzaprine (FLEXERIL) 10 MG tablet Take 10 mg by mouth daily as needed for muscle spasms.     escitalopram (LEXAPRO) 10 MG tablet Take 10 mg by mouth 3 (three) times a week.     famotidine (PEPCID) 20 MG tablet Take 20 mg by mouth daily.     HYDROmorphone (DILAUDID) 2 MG tablet Take 1-2 tablets (2-4 mg total) by mouth every 6 (six) hours as needed for severe pain. 42 tablet 0   methylPREDNISolone sodium succinate (SOLU-MEDROL) 1000 MG injection Inject 1,000 mg into the vein every 28 (twenty-eight) days. Methylprednisolone 1g IV every 28 days at [facility] 1 each 11   pregabalin (LYRICA) 100 MG capsule Take 100 mg by mouth 2 (two) times daily.     zolpidem (AMBIEN) 10 MG tablet Take 10 mg by mouth at bedtime.     No current facility-administered medications on file prior to visit.    Allergies: No Known Allergies  Vital Signs:  BP 119/80   Pulse  78   Ht 5\' 4"  (1.626 m)   Wt 204 lb (92.5 kg)   SpO2 96%   BMI 35.02 kg/m   Neurological Exam: MENTAL STATUS including orientation to time, place, person, recent and remote memory, attention span and concentration, language, and fund of knowledge is normal.  Speech is not dysarthric.  CRANIAL NERVES:   Normal conjugate, extra-ocular eye movements in all directions of gaze.  No ptosis.     MOTOR:  Moderate right hand and forearm atrophy. No fasciculations or abnormal movements.  No pronator drift.   Upper  Extremity:  Right  Left  Deltoid  5/5   5/5   Biceps  5/5   5/5   Triceps  5/5   5/5   Wrist extensors  2/5   5/5   Wrist flexors 5-/5   5/5   Finger extensors  1/5   5/5   Finger flexors  4+/5   5/5   Dorsal interossei  1/5   5/5   Abductor pollicis  2/5   5/5   Tone (Ashworth scale)  0  0   Lower Extremity:  Right  Left  Hip flexors  5/5   5/5   Knee flexors  5/5   5/5   Knee extensors  5/5   5/5   Dorsiflexors  5/5   5/5   Plantarflexors  5/5   5/5   Tone (Ashworth scale)  0  0   MSRs:                                           Right        Left brachioradialis 2+  2+  biceps 2+  2+  triceps 2+  2+  patellar 1+  2+  ankle jerk 2+  2+  plantar response down  down   SENSORY: Vibration intact throughout   COORDINATION/GAIT:  Normal finger-to- nose-finger. Gait is normal, unassisted and stable.   Data: NCS/EMG of the upper extremities 11/12/2020: This is a complex study.  The electrophysiologic findings are most suggestive of an asymmetric sensorimotor polyneuropathy, with axonal and demyelinating features, affecting the upper extremities (worse on the right), which has progressed compared to study on 04/29/2020.  Recommend further evaluation for polyradiculoneuropathy.  MRI brain wo contrast 12/08/2020: Normal  CSF 12/15/2020:  R0  W1  P43  G39*  Cytology neg, MBP <2, ACE 8, IgG index 0.48, OCB - 4 bands in CSF not present in serum  Labs 12/30/2020: ESR 57*, CRP <1, RF neg, ANA neg, SSA/B neg, cryoglobulins neg, c/p ANCA neg, anti-MAG neg, heavy metal panel neg    Thank you for allowing me to participate in patient's care.  If I can answer any additional questions, I would be pleased to do so.    Sincerely,     K. Allena Katz, DO

## 2022-09-14 ENCOUNTER — Inpatient Hospital Stay (HOSPITAL_COMMUNITY): Admission: RE | Admit: 2022-09-14 | Payer: Medicare PPO | Source: Ambulatory Visit

## 2022-09-14 ENCOUNTER — Ambulatory Visit: Payer: Medicare PPO

## 2022-09-16 ENCOUNTER — Encounter: Payer: Medicare PPO | Attending: Neurology | Admitting: *Deleted

## 2022-09-16 VITALS — BP 117/78 | HR 75 | Temp 97.7°F | Resp 16 | Ht 64.0 in | Wt 200.0 lb

## 2022-09-16 DIAGNOSIS — G619 Inflammatory polyneuropathy, unspecified: Secondary | ICD-10-CM | POA: Insufficient documentation

## 2022-09-16 MED ORDER — SODIUM CHLORIDE 0.9 % IV SOLN
1000.0000 mg | Freq: Once | INTRAVENOUS | Status: AC
  Administered 2022-09-16: 1000 mg via INTRAVENOUS
  Filled 2022-09-16: qty 16

## 2022-09-16 NOTE — Progress Notes (Signed)
Diagnosis: Inflammatory Neuropathy  Provider:  Nita Sickle DO  Procedure: IV Infusion  IV Type: Peripheral, IV Location: R Antecubital  Solumedrol (Methylprednisolone), Dose: 1000 mg  Infusion Start Time: 1349 pm  Infusion Stop Time: 1455 pm  Post Infusion IV Care: Observation period completed and Peripheral IV Discontinued  Discharge: Condition: Good, Destination: Home . AVS Provided  Performed by:  Forrest Moron, RN

## 2022-10-17 ENCOUNTER — Ambulatory Visit: Payer: Medicare PPO

## 2022-10-19 ENCOUNTER — Encounter (HOSPITAL_COMMUNITY): Payer: Medicare PPO

## 2022-10-19 ENCOUNTER — Encounter: Payer: Medicare PPO | Attending: Neurology | Admitting: Emergency Medicine

## 2022-10-19 VITALS — BP 117/75 | HR 85 | Temp 98.6°F | Resp 16

## 2022-10-19 DIAGNOSIS — G619 Inflammatory polyneuropathy, unspecified: Secondary | ICD-10-CM

## 2022-10-19 MED ORDER — SODIUM CHLORIDE 0.9 % IV SOLN
1000.0000 mg | Freq: Once | INTRAVENOUS | Status: AC
  Administered 2022-10-19: 1000 mg via INTRAVENOUS
  Filled 2022-10-19: qty 16

## 2022-10-19 NOTE — Progress Notes (Signed)
Diagnosis: Inflammatory Neuropathy  Provider:  Nita Sickle DO  Procedure: IV Infusion  IV Type: Peripheral, IV Location: R Antecubital  Solumedrol (Methylprednisolone), Dose: 1000 mg  Infusion Start Time: 1346  Infusion Stop Time: 1442  Post Infusion IV Care: Peripheral IV Discontinued  Discharge: Condition: Good, Destination: Home . AVS Provided  Performed by:  Marlow Baars [place]king[place]-Burchett, RN

## 2022-11-16 ENCOUNTER — Encounter (HOSPITAL_COMMUNITY): Payer: Medicare PPO

## 2022-11-17 ENCOUNTER — Encounter: Payer: Medicare PPO | Attending: Neurology | Admitting: Emergency Medicine

## 2022-11-17 VITALS — BP 133/78 | HR 74 | Temp 97.8°F | Resp 18

## 2022-11-17 DIAGNOSIS — G619 Inflammatory polyneuropathy, unspecified: Secondary | ICD-10-CM | POA: Insufficient documentation

## 2022-11-17 MED ORDER — SODIUM CHLORIDE 0.9 % IV SOLN
1000.0000 mg | Freq: Once | INTRAVENOUS | Status: AC
  Administered 2022-11-17: 1000 mg via INTRAVENOUS
  Filled 2022-11-17: qty 16

## 2022-11-17 NOTE — Progress Notes (Signed)
Diagnosis:  Inflammatory Neuropathy   Provider:  Nita Sickle DO  Procedure: IV Infusion  IV Type: Peripheral, IV Location: L Antecubital  Solumedrol (Methylprednisolone), Dose: 1000 mg  Infusion Start Time: 1326  Infusion Stop Time: 1430  Post Infusion IV Care: Peripheral IV Discontinued  Discharge: Condition: Good, Destination: Home . AVS Provided  Performed by:  Arrie Senate, RN

## 2023-03-14 ENCOUNTER — Ambulatory Visit: Payer: Self-pay | Admitting: Neurology

## 2023-05-15 NOTE — H&P (Signed)
 TOTAL KNEE ADMISSION H&P  Patient is being admitted for left total knee arthroplasty.  Subjective:  Chief Complaint: Left knee pain.  HPI: Hailey Bates, 68 y.o. female has a history of pain and functional disability in the left knee due to arthritis and has failed non-surgical conservative treatments for greater than 12 weeks to include NSAID's and/or analgesics, corticosteriod injections, viscosupplementation injections, use of assistive devices, and activity modification. Onset of symptoms was gradual, starting several years ago with gradually worsening course since that time. The patient noted no past surgery on the left knee.  Patient currently rates pain in the left knee at 8 out of 10 with activity. Patient has night pain, worsening of pain with activity and weight bearing, and pain that interferes with activities of daily living. Patient has evidence of  arthritic changes in the patellofemoral joint, and the lateral compartment. There is a posterior horn lateral meniscal tear and full-thickness chondral loss throughout most of the compartment with subchondral bone edema extending into the lateral femoral condyle. The medial side appears normal  by imaging studies. There is no active infection.  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   Carpal tunnel syndrome of right wrist 09/16/2020   Pain in right hand 09/14/2020   Pain in left arm 08/24/2020    Past Medical History:  Diagnosis Date   Anxiety    Arthritis    GERD  (gastroesophageal reflux disease)    Hypertension    Neuromuscular disorder (HCC)    sciatica   Situs inversus     Past Surgical History:  Procedure Laterality Date   ANTERIOR AND POSTERIOR REPAIR  01/04/2011   Procedure: ANTERIOR (CYSTOCELE) AND POSTERIOR REPAIR (RECTOCELE);  Surgeon: Devorah Fonder, MD;  Location: [facility];  Service: Urology;  Laterality: N/A;  anterior repair/cysto, with graft,no posterior repair   BLADDER SUSPENSION  01/04/2011   Procedure: SPARC PROCEDURE;  Surgeon: Devorah Fonder, MD;  Location: [facility];  Service: Urology;  Laterality: N/A;   CARDIAC CATHETERIZATION     CYSTOSCOPY  01/04/2011   Procedure: CYSTOSCOPY;  Surgeon: Devorah Fonder, MD;  Location: [facility];  Service: Urology;  Laterality: N/A;   NASAL SINUS SURGERY     TOTAL KNEE ARTHROPLASTY Right 11/01/2021   Procedure: TOTAL KNEE ARTHROPLASTY;  Surgeon: Liliane Rei, MD;  Location: [facility];  Service: Orthopedics;  Laterality: Right;   VAGINAL HYSTERECTOMY  01/04/2011   Procedure: HYSTERECTOMY VAGINAL;  Surgeon: Armandina Lana Romine;  Location: [facility];  Service: Gynecology;  Laterality: N/A;   VAGINAL PROLAPSE REPAIR  01/04/2011   Procedure: VAGINAL VAULT SUSPENSION;  Surgeon: Devorah Fonder, MD;  Location: [facility];  Service: Urology;  Laterality: N/A;    Prior to Admission medications   Medication Sig Start Date End Date Taking? Authorizing Provider  amLODipine-olmesartan (AZOR) 5-20 MG tablet Take 1 tablet by mouth daily.    [provider]  cyclobenzaprine (FLEXERIL) 10 MG tablet Take 10 mg by mouth daily as needed for muscle spasms.    [provider]  escitalopram (LEXAPRO) 10 MG  tablet Take 10 mg by mouth 3 (three) times a week. 10/11/21   [provider]  famotidine (PEPCID) 20 MG tablet Take 20 mg by mouth daily.    [provider]  HYDROmorphone (DILAUDID) 2 MG tablet Take 1-2 tablets (2-4 mg total) by mouth every 6 (six) hours as needed for severe pain.  11/02/21   Edmisten, Kristie L, PA  methylPREDNISolone sodium succinate (SOLU-MEDROL) 1000 MG injection Inject 1,000 mg into the vein every 28 (twenty-eight) days. Methylprednisolone 1g IV every 28 days at [facility] 03/31/22   Patel, Donika K, DO  pregabalin (LYRICA) 100 MG capsule Take 100 mg by mouth 2 (two) times daily. 06/21/22   [provider]  zolpidem (AMBIEN) 10 MG tablet Take 10 mg by mouth at bedtime.    [provider]    No Known Allergies  Social History   Socioeconomic History   Marital status: Divorced    Spouse name: Not on file   Number of children: 1   Years of education: Not on file   Highest education level: Not on file  Occupational History   Not on file  Tobacco Use   Smoking status: Never   Smokeless tobacco: Never  Vaping Use   Vaping status: Never Used  Substance and Sexual Activity   Alcohol use: Never   Drug use: No   Sexual activity: Not on file  Other Topics Concern   Not on file  Social History Narrative   Right Handed    Lives in a one story home   Drinks Caffeine    Social Drivers of Health   Financial Resource Strain: Not on file  Food Insecurity: No Food Insecurity (11/01/2021)   Hunger Vital Sign    Worried About Running Out of Food in the Last Year: Never true    Ran Out of Food in the Last Year: Never true  Transportation Needs: No Transportation Needs (11/01/2021)   PRAPARE - Administrator, Civil Service (Medical): No    Lack of Transportation (Non-Medical): No  Physical Activity: Not on file  Stress: Not on file  Social Connections: Not on file  Intimate Partner Violence: Not At Risk (11/01/2021)   Humiliation, Afraid, Rape, and Kick questionnaire    Fear of Current or Ex-Partner: No    Emotionally Abused: No    Physically Abused: No    Sexually Abused: No    Tobacco Use: Low Risk  (04/20/2022)   Patient History    Smoking Tobacco Use: Never    Smokeless Tobacco Use: Never    Passive Exposure:  Not on file   Social History   Substance and Sexual Activity  Alcohol Use Never    Family History  Problem Relation Age of Onset   Hypertension Mother    Diabetes Father    Hypertension Father    Heart disease Brother    Arthritis/Rheumatoid Brother    Healthy Daughter     ROS  Objective:  Physical Exam: Well nourished and well developed.  General: Alert and oriented x3, cooperative and pleasant, no acute distress.  Head: normocephalic, atraumatic, neck supple.  Eyes: EOMI. Abdomen: non-tender to palpation and soft, normoactive bowel sounds. Musculoskeletal: - Evaluation of her left knee shows a large effusion.  - Range of motion is approximately 5-125.  - There is crepitus on range of motion.  - Tenderness is greater laterally than medially with no instability. Calves soft and nontender. Motor function intact in LE. Strength 5/5 LE  bilaterally. Neuro: Distal pulses 2+. Sensation to light touch intact in LE.  Vital signs in last 24 hours: BP: ()/()  Arterial Line BP: ()/()   Imaging Review Plain radiographs demonstrate severe degenerative joint disease of the left knee. The overall alignment is neutral. The bone quality appears to be adequate for age and reported activity level.  Assessment/Plan:  End stage arthritis, left knee   The patient history, physical examination, clinical judgment of the provider and imaging studies are consistent with end stage degenerative joint disease of the left knee and total knee arthroplasty is deemed medically necessary. The treatment options including medical management, injection therapy arthroscopy and arthroplasty were discussed at length. The risks and benefits of total knee arthroplasty were presented and reviewed. The risks due to aseptic loosening, infection, stiffness, patella tracking problems, thromboembolic complications and other imponderables were discussed. The patient acknowledged the explanation, agreed to proceed with  the plan and consent was signed. Patient is being admitted for inpatient treatment for surgery, pain control, PT, OT, prophylactic antibiotics, VTE prophylaxis, progressive ambulation and ADLs and discharge planning. The patient is planning to be discharged  home .  Patient's anticipated LOS is less than 2 midnights, meeting these requirements: - Lives within 1 hour of care - Has a competent adult at home to recover with post-op - NO history of  - Diabetes  - Coronary Artery Disease  - Heart failure  - Heart attack  - Stroke  - DVT/VTE  - Cardiac arrhythmia  - Respiratory Failure/COPD  - Renal failure  - Anemia  - Advanced Liver disease  Therapy Plans: [facility] Disposition: Home with Daughter Planned DVT Prophylaxis: Aspirin 81 mg BID DME Needed: None PCP: Mindi Alto, NP (clearance received) TXA: IV Allergies: NKDA Anesthesia Concerns: None BMI: 34.9 Last HgbA1c: not diabetic  Pharmacy: [facility]  Other: -oxycodone 10 mg q 6 hrs - discussed dilaudid for breakthrough pain  - Patient was instructed on what medications to stop prior to surgery. - Follow-up visit in 2 weeks with Dr. France Ina - Begin physical therapy following surgery - Pre-operative lab work as pre-surgical testing - Prescriptions will be provided in hospital at time of discharge  R. Brinton Canavan, PA-C Orthopedic Surgery [facility] [place]

## 2023-05-29 NOTE — Patient Instructions (Signed)
 SURGICAL WAITING ROOM VISITATION  Patients having surgery or a procedure may have no more than 2 support people in the waiting area - these visitors may rotate.    Children under the age of 34 must have an adult with them who is not the patient.  Due to an increase in RSV and influenza rates and associated hospitalizations, children ages 19 and under may not visit patients in [facility] hospitals.  Visitors with respiratory illnesses are discouraged from visiting and should remain at home.  If the patient needs to stay at the hospital during part of their recovery, the visitor guidelines for inpatient rooms apply. Pre-op nurse will coordinate an appropriate time for 1 support person to accompany patient in pre-op.  This support person may not rotate.    Please refer to the [facility] website for the visitor guidelines for Inpatients (after your surgery is over and you are in a regular room).       Your procedure is scheduled on:  06/12/2023    Report to [facility] Main Entrance    Report to admitting at  0515 AM   Call this number if you have problems the morning of surgery [phone]   Do not eat food :After Midnight.   After Midnight you may have the following liquids until __ 0415____ AM  DAY OF SURGERY  Water  Non-Citrus Juices (without pulp, NO RED-Apple, White grape, White cranberry) Black Coffee (NO MILK/CREAM OR CREAMERS, sugar ok)  Clear Tea (NO MILK/CREAM OR CREAMERS, sugar ok) regular and decaf                             Plain Jell-O (NO RED)                                           Fruit ices (not with fruit pulp, NO RED)                                     Popsicles (NO RED)                                                               Sports drinks like Gatorade (NO RED)                    The day of surgery:  Drink ONE (1) Pre-Surgery Clear Ensure or G2 at 0415  AM  ( have completed by ) the morning of surgery. Drink in one sitting. Do not sip.   This drink was given to you during your hospital   pre-op appointment visit. Nothing else to drink after completing the  Pre-Surgery Clear Ensure or G2.          If you have questions, please contact your surgeon's office.       Oral Hygiene is also important to reduce your risk of infection.  Remember - BRUSH YOUR TEETH THE MORNING OF SURGERY WITH YOUR REGULAR TOOTHPASTE  DENTURES WILL BE REMOVED PRIOR TO SURGERY PLEASE DO NOT APPLY "Poly grip" OR ADHESIVES!!!   Do NOT smoke after Midnight   Stop all vitamins and herbal supplements 7 days before surgery.   Take these medicines the morning of surgery with A SIP OF WATER : lexapro , pepcid , lyrica    DO NOT TAKE ANY ORAL DIABETIC MEDICATIONS DAY OF YOUR SURGERY  Bring CPAP mask and tubing day of surgery.                              You may not have any metal on your body including hair pins, jewelry, and body piercing             Do not wear make-up, lotions, powders, perfumes/cologne, or deodorant  Do not wear nail polish including gel and S&S, artificial/acrylic nails, or any other type of covering on natural nails including finger and toenails. If you have artificial nails, gel coating, etc. that needs to be removed by a nail salon please have this removed prior to surgery or surgery may need to be canceled/ delayed if the surgeon/ anesthesia feels like they are unable to be safely monitored.   Do not shave  48 hours prior to surgery.               Men may shave face and neck.   Do not bring valuables to the hospital. [place] IS NOT             RESPONSIBLE   FOR VALUABLES.   Contacts, glasses, dentures or bridgework may not be worn into surgery.   Bring small overnight bag day of surgery.   DO NOT BRING YOUR HOME MEDICATIONS TO THE HOSPITAL. PHARMACY WILL DISPENSE MEDICATIONS LISTED ON YOUR MEDICATION LIST TO YOU DURING YOUR ADMISSION IN THE HOSPITAL!    Patients discharged on the  day of surgery will not be allowed to drive home.  Someone NEEDS to stay with you for the first 24 hours after anesthesia.   Special Instructions: Bring a copy of your healthcare power of attorney and living will documents the day of surgery if you haven't scanned them before.              Please read over the following fact sheets you were given: IF YOU HAVE QUESTIONS ABOUT YOUR PRE-OP INSTRUCTIONS PLEASE CALL [phone]   If you received a COVID test during your pre-op visit  it is requested that you wear a mask when out in public, stay away from anyone that may not be feeling well and notify your surgeon if you develop symptoms. If you test positive for Covid or have been in contact with anyone that has tested positive in the last 10 days please notify you surgeon.      Pre-operative 5 CHG Bath Instructions   You can play a key role in reducing the risk of infection after surgery. Your skin needs to be as free of germs as possible. You can reduce the number of germs on your skin by washing with CHG (chlorhexidine  gluconate) soap before surgery. CHG is an antiseptic soap that kills germs and continues to kill germs even after washing.   DO NOT use if you have an allergy to chlorhexidine /CHG or antibacterial soaps. If your skin becomes reddened or irritated, stop using the CHG and notify one of our  RNs at [phone].   Please shower with the CHG soap starting 4 days before surgery using the following schedule:     Please keep in mind the following:  DO NOT shave, including legs and underarms, starting the day of your first shower.   You may shave your face at any point before/day of surgery.  Place clean sheets on your bed the day you start using CHG soap. Use a clean washcloth (not used since being washed) for each shower. DO NOT sleep with pets once you start using the CHG.   CHG Shower Instructions:  If you choose to wash your hair and private area, wash first with your normal  shampoo/soap.  After you use shampoo/soap, rinse your hair and body thoroughly to remove shampoo/soap residue.  Turn the water  OFF and apply about 3 tablespoons (45 ml) of CHG soap to a CLEAN washcloth.  Apply CHG soap ONLY FROM YOUR NECK DOWN TO YOUR TOES (washing for 3-5 minutes)  DO NOT use CHG soap on face, private areas, open wounds, or sores.  Pay special attention to the area where your surgery is being performed.  If you are having back surgery, having someone wash your back for you may be helpful. Wait 2 minutes after CHG soap is applied, then you may rinse off the CHG soap.  Pat dry with a clean towel  Put on clean clothes/pajamas   If you choose to wear lotion, please use ONLY the CHG-compatible lotions on the back of this paper.     Additional instructions for the day of surgery: DO NOT APPLY any lotions, deodorants, cologne, or perfumes.   Put on clean/comfortable clothes.  Brush your teeth.  Ask your nurse before applying any prescription medications to the skin.      CHG Compatible Lotions   Aveeno Moisturizing lotion  Cetaphil Moisturizing Cream  Cetaphil Moisturizing Lotion  Clairol Herbal Essence Moisturizing Lotion, Dry Skin  Clairol Herbal Essence Moisturizing Lotion, Extra Dry Skin  Clairol Herbal Essence Moisturizing Lotion, Normal Skin  Curel Age Defying Therapeutic Moisturizing Lotion with Alpha Hydroxy  Curel Extreme Care Body Lotion  Curel Soothing Hands Moisturizing Hand Lotion  Curel Therapeutic Moisturizing Cream, Fragrance-Free  Curel Therapeutic Moisturizing Lotion, Fragrance-Free  Curel Therapeutic Moisturizing Lotion, Original Formula  Eucerin Daily Replenishing Lotion  Eucerin Dry Skin Therapy Plus Alpha Hydroxy Crme  Eucerin Dry Skin Therapy Plus Alpha Hydroxy Lotion  Eucerin Original Crme  Eucerin Original Lotion  Eucerin Plus Crme Eucerin Plus Lotion  Eucerin TriLipid Replenishing Lotion  Keri Anti-Bacterial Hand Lotion  Keri Deep  Conditioning Original Lotion Dry Skin Formula Softly Scented  Keri Deep Conditioning Original Lotion, Fragrance Free Sensitive Skin Formula  Keri Lotion Fast Absorbing Fragrance Free Sensitive Skin Formula  Keri Lotion Fast Absorbing Softly Scented Dry Skin Formula  Keri Original Lotion  Keri Skin Renewal Lotion Keri Silky Smooth Lotion  Keri Silky Smooth Sensitive Skin Lotion  Nivea Body Creamy Conditioning Oil  Nivea Body Extra Enriched Teacher, adult education Moisturizing Lotion Nivea Crme  Nivea Skin Firming Lotion  NutraDerm 30 Skin Lotion  NutraDerm Skin Lotion  NutraDerm Therapeutic Skin Cream  NutraDerm Therapeutic Skin Lotion  ProShield Protective Hand Cream  Provon moisturizing lotion

## 2023-05-29 NOTE — Progress Notes (Signed)
 Anesthesia Review:  PCP: Cardiologist :  PPM/ ICD: Device Orders: Rep Notified:  Chest x-ray : EKG : Echo : Stress test: Cardiac Cath :   Activity level:  Sleep Study/ CPAP : Fasting Blood Sugar :      / Checks Blood Sugar -- times a day:    Blood Thinner/ Instructions /Last Dose: ASA / Instructions/ Last Dose :

## 2023-05-31 ENCOUNTER — Encounter (HOSPITAL_COMMUNITY): Payer: Self-pay

## 2023-05-31 ENCOUNTER — Other Ambulatory Visit: Payer: Self-pay

## 2023-05-31 ENCOUNTER — Encounter (HOSPITAL_COMMUNITY)
Admission: RE | Admit: 2023-05-31 | Discharge: 2023-05-31 | Disposition: A | Discharge status: Home / Self Care | Source: Ambulatory Visit | Attending: Orthopedic Surgery | Admitting: Orthopedic Surgery

## 2023-05-31 VITALS — BP 137/87 | HR 88 | Temp 98.3°F | Resp 16 | Ht 64.0 in | Wt 204.0 lb

## 2023-05-31 DIAGNOSIS — Z01818 Encounter for other preprocedural examination: Secondary | ICD-10-CM | POA: Diagnosis present

## 2023-05-31 HISTORY — DX: Pneumonia, unspecified organism: J18.9

## 2023-05-31 LAB — BASIC METABOLIC PANEL WITH GFR
Anion gap: 10 (ref 5–15)
BUN: 24 mg/dL — ABNORMAL HIGH (ref 8–23)
CO2: 23 mmol/L (ref 22–32)
Calcium: 9.6 mg/dL (ref 8.9–10.3)
Chloride: 106 mmol/L (ref 98–111)
Creatinine, Ser: 0.77 mg/dL (ref 0.44–1.00)
GFR, Estimated: >60 mL/min (ref >60)
Glucose, Bld: 95 mg/dL (ref 70–99)
Potassium: 4.6 mmol/L (ref 3.5–5.1)
Sodium: 139 mmol/L (ref 135–145)

## 2023-05-31 LAB — CBC
HCT: 44.5 % (ref 36.0–46.0)
Hemoglobin: 13.9 g/dL (ref 12.0–15.0)
MCH: 27.7 pg (ref 26.0–34.0)
MCHC: 31.2 g/dL (ref 30.0–36.0)
MCV: 88.6 fL (ref 80.0–100.0)
Platelets: 238 10*3/uL (ref 150–400)
RBC: 5.02 MIL/uL (ref 3.87–5.11)
RDW: 13.9 % (ref 11.5–15.5)
WBC: 8.3 10*3/uL (ref 4.0–10.5)
nRBC: 0 % (ref 0.0–0.2)

## 2023-05-31 LAB — SURGICAL PCR SCREEN
MRSA, PCR: NEGATIVE
Staphylococcus aureus: NEGATIVE

## 2023-06-09 ENCOUNTER — Encounter (HOSPITAL_COMMUNITY): Payer: Self-pay | Admitting: Orthopedic Surgery

## 2023-06-09 NOTE — Anesthesia Preprocedure Evaluation (Addendum)
 Anesthesia Evaluation  Patient identified by MRN, date of birth, ID band Patient awake    Reviewed: Allergy & Precautions, NPO status , Patient's Chart, lab work & pertinent test results  Airway Mallampati: II       Dental no notable dental hx. (+) Dental Advisory Given, Teeth Intact, Caps   Pulmonary pneumonia, resolved   Pulmonary exam normal breath sounds clear to auscultation       Cardiovascular hypertension, Pt. 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   Neuro/Psych   Anxiety     Chronic pain syndrome  Neuromuscular disease    GI/Hepatic Neg liver ROS,GERD  Medicated,,  Endo/Other  Obesity  Renal/GU negative Renal ROS  negative genitourinary   Musculoskeletal  (+) Arthritis , Osteoarthritis,  OA left knee   Abdominal  (+) + obese  Peds  Hematology negative hematology ROS (+)   Anesthesia Other Findings   Reproductive/Obstetrics                             Anesthesia Physical Anesthesia Plan  ASA: 3  Anesthesia Plan: Spinal   Post-op Pain Management: Regional block* and Minimal or no pain anticipated   Induction: Intravenous  PONV Risk Score and Plan: 3 and Treatment may vary due to age or medical condition and Propofol  infusion  Airway Management Planned: Natural Airway and Simple Face Mask  Additional Equipment: None  Intra-op Plan:   Post-operative Plan:   Informed Consent: I have reviewed the patients History and Physical, chart, labs and discussed the procedure including the risks, benefits and alternatives for the proposed anesthesia with the patient or authorized representative who has indicated his/her understanding and acceptance.     Dental advisory given  Plan Discussed with: CRNA and Anesthesiologist  Anesthesia Plan Comments: (Place EKG leads  on opposite side due to situs inversus)        Anesthesia Quick Evaluation

## 2023-06-12 ENCOUNTER — Ambulatory Visit (HOSPITAL_COMMUNITY): Payer: Self-pay | Admitting: Anesthesiology

## 2023-06-12 ENCOUNTER — Ambulatory Visit (HOSPITAL_COMMUNITY): Payer: Self-pay | Admitting: Physician Assistant

## 2023-06-12 ENCOUNTER — Other Ambulatory Visit: Payer: Self-pay

## 2023-06-12 ENCOUNTER — Encounter (HOSPITAL_COMMUNITY)
Admission: RE | Disposition: A | Discharge status: Home / Self Care | Payer: Self-pay | Source: Home / Self Care | Attending: Orthopedic Surgery

## 2023-06-12 ENCOUNTER — Observation Stay (HOSPITAL_COMMUNITY)
Admission: RE | Admit: 2023-06-12 | Discharge: 2023-06-13 | Disposition: A | Discharge status: Home / Self Care | Attending: Orthopedic Surgery | Admitting: Orthopedic Surgery

## 2023-06-12 ENCOUNTER — Encounter (HOSPITAL_COMMUNITY): Payer: Self-pay | Admitting: Orthopedic Surgery

## 2023-06-12 DIAGNOSIS — J189 Pneumonia, unspecified organism: Secondary | ICD-10-CM

## 2023-06-12 DIAGNOSIS — Z96651 Presence of right artificial knee joint: Secondary | ICD-10-CM | POA: Diagnosis not present

## 2023-06-12 DIAGNOSIS — M179 Osteoarthritis of knee, unspecified: Principal | ICD-10-CM | POA: Diagnosis present

## 2023-06-12 DIAGNOSIS — Z01818 Encounter for other preprocedural examination: Secondary | ICD-10-CM

## 2023-06-12 DIAGNOSIS — I10 Essential (primary) hypertension: Secondary | ICD-10-CM | POA: Diagnosis not present

## 2023-06-12 DIAGNOSIS — F419 Anxiety disorder, unspecified: Secondary | ICD-10-CM | POA: Diagnosis not present

## 2023-06-12 DIAGNOSIS — Z79899 Other long term (current) drug therapy: Secondary | ICD-10-CM | POA: Insufficient documentation

## 2023-06-12 DIAGNOSIS — M1712 Unilateral primary osteoarthritis, left knee: Secondary | ICD-10-CM | POA: Diagnosis present

## 2023-06-12 HISTORY — PX: TOTAL KNEE ARTHROPLASTY: SHX125

## 2023-06-12 SURGERY — ARTHROPLASTY, KNEE, TOTAL
Anesthesia: Spinal | Site: Knee | Laterality: Left

## 2023-06-12 MED ORDER — LACTATED RINGERS IV SOLN: INTRAVENOUS | Status: DC

## 2023-06-12 MED ORDER — ESCITALOPRAM OXALATE 10 MG PO TABS
10.0000 mg | ORAL_TABLET | Freq: Every day | ORAL | Status: DC
  Administered 2023-06-12 – 2023-06-13 (×2): 10 mg via ORAL
  Filled 2023-06-12 (×2): qty 1

## 2023-06-12 MED ORDER — DOCUSATE SODIUM 100 MG PO CAPS
100.0000 mg | ORAL_CAPSULE | Freq: Two times a day (BID) | ORAL | Status: DC
  Administered 2023-06-12 – 2023-06-13 (×2): 100 mg via ORAL
  Filled 2023-06-12 (×2): qty 1

## 2023-06-12 MED ORDER — FENTANYL CITRATE (PF) 100 MCG/2ML IJ SOLN
INTRAMUSCULAR | Status: AC
  Filled 2023-06-12: qty 2

## 2023-06-12 MED ORDER — MIDAZOLAM HCL 2 MG/2ML IJ SOLN
INTRAMUSCULAR | Status: DC | PRN
  Administered 2023-06-12: 2 mg via INTRAVENOUS

## 2023-06-12 MED ORDER — OXYCODONE HCL 5 MG PO TABS: 5.0000 mg | ORAL_TABLET | Freq: Once | ORAL | Status: DC | PRN

## 2023-06-12 MED ORDER — 0.9 % SODIUM CHLORIDE (POUR BTL) OPTIME
TOPICAL | Status: DC | PRN
  Administered 2023-06-12: 1000 mL

## 2023-06-12 MED ORDER — AMLODIPINE BESYLATE 5 MG PO TABS
5.0000 mg | ORAL_TABLET | Freq: Every day | ORAL | Status: DC
  Administered 2023-06-13: 5 mg via ORAL
  Filled 2023-06-12: qty 1

## 2023-06-12 MED ORDER — TRANEXAMIC ACID-NACL 1000-0.7 MG/100ML-% IV SOLN
1000.0000 mg | INTRAVENOUS | Status: AC
  Administered 2023-06-12: 1000 mg via INTRAVENOUS
  Filled 2023-06-12: qty 100

## 2023-06-12 MED ORDER — ROPIVACAINE HCL 5 MG/ML IJ SOLN
INTRAMUSCULAR | Status: DC | PRN
  Administered 2023-06-12: 30 mL via PERINEURAL

## 2023-06-12 MED ORDER — ONDANSETRON HCL 4 MG/2ML IJ SOLN: 4.0000 mg | Freq: Once | INTRAMUSCULAR | Status: DC | PRN

## 2023-06-12 MED ORDER — DEXAMETHASONE SODIUM PHOSPHATE 10 MG/ML IJ SOLN
INTRAMUSCULAR | Status: AC
  Filled 2023-06-12: qty 1

## 2023-06-12 MED ORDER — PHENYLEPHRINE 80 MCG/ML (10ML) SYRINGE FOR IV PUSH (FOR BLOOD PRESSURE SUPPORT)
PREFILLED_SYRINGE | INTRAVENOUS | Status: AC
  Filled 2023-06-12: qty 10

## 2023-06-12 MED ORDER — POVIDONE-IODINE 10 % EX SWAB: 2.0000 | Freq: Once | CUTANEOUS | Status: DC

## 2023-06-12 MED ORDER — SODIUM CHLORIDE (PF) 0.9 % IJ SOLN
INTRAMUSCULAR | Status: DC | PRN
  Administered 2023-06-12: 80 mL

## 2023-06-12 MED ORDER — OXYCODONE HCL 5 MG PO TABS
5.0000 mg | ORAL_TABLET | ORAL | Status: DC | PRN
  Administered 2023-06-12 – 2023-06-13 (×5): 5 mg via ORAL
  Filled 2023-06-12 (×5): qty 1

## 2023-06-12 MED ORDER — HYDROMORPHONE HCL 1 MG/ML IJ SOLN: 0.2500 mg | INTRAMUSCULAR | Status: DC | PRN

## 2023-06-12 MED ORDER — PHENYLEPHRINE HCL (PRESSORS) 10 MG/ML IV SOLN
INTRAVENOUS | Status: AC
  Filled 2023-06-12: qty 1

## 2023-06-12 MED ORDER — METOCLOPRAMIDE HCL 5 MG PO TABS: 5.0000 mg | ORAL_TABLET | Freq: Three times a day (TID) | ORAL | Status: DC | PRN

## 2023-06-12 MED ORDER — ACETAMINOPHEN 325 MG PO TABS: 325.0000 mg | ORAL_TABLET | Freq: Four times a day (QID) | ORAL | Status: DC | PRN

## 2023-06-12 MED ORDER — BUPIVACAINE LIPOSOME 1.3 % IJ SUSP: 20.0000 mL | Freq: Once | INTRAMUSCULAR | Status: DC

## 2023-06-12 MED ORDER — OXYCODONE HCL 5 MG/5ML PO SOLN: 5.0000 mg | Freq: Once | ORAL | Status: DC | PRN

## 2023-06-12 MED ORDER — IRBESARTAN 150 MG PO TABS
150.0000 mg | ORAL_TABLET | Freq: Every day | ORAL | Status: DC
  Administered 2023-06-13: 150 mg via ORAL
  Filled 2023-06-12: qty 1

## 2023-06-12 MED ORDER — ASPIRIN 81 MG PO CHEW
81.0000 mg | CHEWABLE_TABLET | Freq: Two times a day (BID) | ORAL | Status: DC
  Administered 2023-06-13: 81 mg via ORAL
  Filled 2023-06-12: qty 1

## 2023-06-12 MED ORDER — PREGABALIN 100 MG PO CAPS
100.0000 mg | ORAL_CAPSULE | Freq: Two times a day (BID) | ORAL | Status: DC
  Administered 2023-06-12 – 2023-06-13 (×3): 100 mg via ORAL
  Filled 2023-06-12 (×3): qty 1

## 2023-06-12 MED ORDER — METHOCARBAMOL 1000 MG/10ML IJ SOLN: 500.0000 mg | Freq: Four times a day (QID) | INTRAMUSCULAR | Status: DC | PRN

## 2023-06-12 MED ORDER — ONDANSETRON HCL 4 MG PO TABS: 4.0000 mg | ORAL_TABLET | Freq: Four times a day (QID) | ORAL | Status: DC | PRN

## 2023-06-12 MED ORDER — BISACODYL 10 MG RE SUPP: 10.0000 mg | Freq: Every day | RECTAL | Status: DC | PRN

## 2023-06-12 MED ORDER — BUPIVACAINE LIPOSOME 1.3 % IJ SUSP
INTRAMUSCULAR | Status: AC
  Filled 2023-06-12: qty 20

## 2023-06-12 MED ORDER — CEFAZOLIN SODIUM-DEXTROSE 2-4 GM/100ML-% IV SOLN
2.0000 g | Freq: Four times a day (QID) | INTRAVENOUS | Status: AC
  Administered 2023-06-12 (×2): 2 g via INTRAVENOUS
  Filled 2023-06-12 (×2): qty 100

## 2023-06-12 MED ORDER — METHOCARBAMOL 500 MG PO TABS
500.0000 mg | ORAL_TABLET | Freq: Four times a day (QID) | ORAL | Status: DC | PRN
  Administered 2023-06-12: 500 mg via ORAL
  Filled 2023-06-12: qty 1

## 2023-06-12 MED ORDER — SODIUM CHLORIDE 0.9 % IV SOLN
INTRAVENOUS | Status: DC
Start: 2023-06-12 — End: 2023-06-13

## 2023-06-12 MED ORDER — MENTHOL 3 MG MT LOZG: 1.0000 | LOZENGE | OROMUCOSAL | Status: DC | PRN

## 2023-06-12 MED ORDER — DEXAMETHASONE SODIUM PHOSPHATE 10 MG/ML IJ SOLN
10.0000 mg | Freq: Once | INTRAMUSCULAR | Status: AC
  Administered 2023-06-13: 10 mg via INTRAVENOUS
  Filled 2023-06-12: qty 1

## 2023-06-12 MED ORDER — HYDROMORPHONE HCL 1 MG/ML IJ SOLN
0.5000 mg | INTRAMUSCULAR | Status: DC | PRN
  Administered 2023-06-12: 1 mg via INTRAVENOUS
  Filled 2023-06-12: qty 1

## 2023-06-12 MED ORDER — SODIUM CHLORIDE (PF) 0.9 % IJ SOLN
INTRAMUSCULAR | Status: AC
  Filled 2023-06-12: qty 50

## 2023-06-12 MED ORDER — FAMOTIDINE 20 MG PO TABS
20.0000 mg | ORAL_TABLET | Freq: Every day | ORAL | Status: DC
  Administered 2023-06-13: 20 mg via ORAL
  Filled 2023-06-12: qty 1

## 2023-06-12 MED ORDER — POLYETHYLENE GLYCOL 3350 17 G PO PACK: 17.0000 g | PACK | Freq: Every day | ORAL | Status: DC | PRN

## 2023-06-12 MED ORDER — FLEET ENEMA RE ENEM: 1.0000 | ENEMA | Freq: Once | RECTAL | Status: DC | PRN

## 2023-06-12 MED ORDER — ACETAMINOPHEN 10 MG/ML IV SOLN
1000.0000 mg | Freq: Four times a day (QID) | INTRAVENOUS | Status: DC
  Administered 2023-06-12 (×2): 1000 mg via INTRAVENOUS
  Filled 2023-06-12: qty 100

## 2023-06-12 MED ORDER — ORAL CARE MOUTH RINSE: 15.0000 mL | Freq: Once | OROMUCOSAL | Status: AC

## 2023-06-12 MED ORDER — METOCLOPRAMIDE HCL 5 MG/ML IJ SOLN: 5.0000 mg | Freq: Three times a day (TID) | INTRAMUSCULAR | Status: DC | PRN

## 2023-06-12 MED ORDER — SODIUM CHLORIDE 0.9 % IR SOLN
Status: DC | PRN
  Administered 2023-06-12: 1000 mL

## 2023-06-12 MED ORDER — DIPHENHYDRAMINE HCL 12.5 MG/5ML PO ELIX: 12.5000 mg | ORAL_SOLUTION | ORAL | Status: DC | PRN

## 2023-06-12 MED ORDER — OXYCODONE-ACETAMINOPHEN 5-325 MG PO TABS
1.0000 | ORAL_TABLET | ORAL | Status: DC | PRN
  Administered 2023-06-12 – 2023-06-13 (×5): 1 via ORAL
  Filled 2023-06-12 (×5): qty 1

## 2023-06-12 MED ORDER — OXYCODONE-ACETAMINOPHEN 10-325 MG PO TABS: 1.0000 | ORAL_TABLET | ORAL | Status: DC | PRN

## 2023-06-12 MED ORDER — ONDANSETRON HCL 4 MG/2ML IJ SOLN: 4.0000 mg | Freq: Four times a day (QID) | INTRAMUSCULAR | Status: DC | PRN

## 2023-06-12 MED ORDER — CEFAZOLIN SODIUM-DEXTROSE 2-4 GM/100ML-% IV SOLN
2.0000 g | INTRAVENOUS | Status: AC
  Administered 2023-06-12: 2 g via INTRAVENOUS
  Filled 2023-06-12: qty 100

## 2023-06-12 MED ORDER — PROPOFOL 1000 MG/100ML IV EMUL
INTRAVENOUS | Status: AC
  Filled 2023-06-12: qty 100

## 2023-06-12 MED ORDER — MIDAZOLAM HCL 2 MG/2ML IJ SOLN
INTRAMUSCULAR | Status: AC
  Filled 2023-06-12: qty 2

## 2023-06-12 MED ORDER — HYDROMORPHONE HCL 2 MG PO TABS
2.0000 mg | ORAL_TABLET | ORAL | Status: DC | PRN
  Administered 2023-06-13: 2 mg via ORAL
  Filled 2023-06-12: qty 1

## 2023-06-12 MED ORDER — SODIUM CHLORIDE (PF) 0.9 % IJ SOLN
INTRAMUSCULAR | Status: AC
  Filled 2023-06-12: qty 10

## 2023-06-12 MED ORDER — CHLORHEXIDINE GLUCONATE 0.12 % MT SOLN
15.0000 mL | Freq: Once | OROMUCOSAL | Status: AC
  Administered 2023-06-12: 15 mL via OROMUCOSAL

## 2023-06-12 MED ORDER — PHENYLEPHRINE 80 MCG/ML (10ML) SYRINGE FOR IV PUSH (FOR BLOOD PRESSURE SUPPORT)
PREFILLED_SYRINGE | INTRAVENOUS | Status: DC | PRN
Start: 2023-06-12 — End: 2023-06-12
  Administered 2023-06-12 (×2): 120 ug via INTRAVENOUS

## 2023-06-12 MED ORDER — AMLODIPINE-OLMESARTAN 5-20 MG PO TABS: 1.0000 | ORAL_TABLET | Freq: Every day | ORAL | Status: DC

## 2023-06-12 MED ORDER — PHENYLEPHRINE HCL-NACL 20-0.9 MG/250ML-% IV SOLN
INTRAVENOUS | Status: DC | PRN
  Administered 2023-06-12: 50 ug/min via INTRAVENOUS
  Administered 2023-06-12: 40 ug/min via INTRAVENOUS

## 2023-06-12 MED ORDER — STERILE WATER FOR IRRIGATION IR SOLN
Status: DC | PRN
  Administered 2023-06-12: 1000 mL

## 2023-06-12 MED ORDER — PROPOFOL 10 MG/ML IV BOLUS
INTRAVENOUS | Status: DC | PRN
  Administered 2023-06-12: 20 mg via INTRAVENOUS
  Administered 2023-06-12: 80 ug/kg/min via INTRAVENOUS
  Administered 2023-06-12: 20 mg via INTRAVENOUS

## 2023-06-12 MED ORDER — DEXAMETHASONE SODIUM PHOSPHATE 10 MG/ML IJ SOLN
8.0000 mg | Freq: Once | INTRAMUSCULAR | Status: AC
  Administered 2023-06-12: 8 mg via INTRAVENOUS

## 2023-06-12 MED ORDER — FENTANYL CITRATE (PF) 100 MCG/2ML IJ SOLN
INTRAMUSCULAR | Status: DC | PRN
  Administered 2023-06-12: 50 ug via INTRAVENOUS

## 2023-06-12 MED ORDER — PHENOL 1.4 % MT LIQD: 1.0000 | OROMUCOSAL | Status: DC | PRN

## 2023-06-12 SURGICAL SUPPLY — 44 items
ATTUNE PSFEM LTSZ5 NARCEM KNEE (Femur) IMPLANT
ATTUNE PSRP INSR SZ5 8 KNEE (Insert) IMPLANT
BAG COUNTER SPONGE SURGICOUNT (BAG) IMPLANT
BAG ZIPLOCK 12X15 (MISCELLANEOUS) ×1 IMPLANT
BASEPLATE TIBIAL ROTATING SZ 4 (Knees) IMPLANT
BLADE SAG 18X100X1.27 (BLADE) ×1 IMPLANT
BLADE SAW SGTL 11.0X1.19X90.0M (BLADE) ×1 IMPLANT
BNDG ELASTIC 6INX 5YD STR LF (GAUZE/BANDAGES/DRESSINGS) ×1 IMPLANT
BOWL SMART MIX CTS (DISPOSABLE) ×1 IMPLANT
CEMENT HV SMART SET (Cement) ×2 IMPLANT
COVER SURGICAL LIGHT HANDLE (MISCELLANEOUS) ×1 IMPLANT
CUFF TRNQT CYL 34X4.125X (TOURNIQUET CUFF) ×1 IMPLANT
DERMABOND ADVANCED .7 DNX12 (GAUZE/BANDAGES/DRESSINGS) ×1 IMPLANT
DRAPE U-SHAPE 47X51 STRL (DRAPES) ×1 IMPLANT
DRSG AQUACEL AG ADV 3.5X10 (GAUZE/BANDAGES/DRESSINGS) ×1 IMPLANT
DURAPREP 26ML APPLICATOR (WOUND CARE) ×1 IMPLANT
ELECT PENCIL ROCKER SW 15FT (MISCELLANEOUS) ×1 IMPLANT
ELECT REM PT RETURN 15FT ADLT (MISCELLANEOUS) ×1 IMPLANT
GLOVE BIO SURGEON STRL SZ 6.5 (GLOVE) IMPLANT
GLOVE BIO SURGEON STRL SZ7 (GLOVE) IMPLANT
GLOVE BIO SURGEON STRL SZ8 (GLOVE) ×1 IMPLANT
GLOVE BIOGEL PI IND STRL 7.0 (GLOVE) ×1 IMPLANT
GLOVE BIOGEL PI IND STRL 8 (GLOVE) ×1 IMPLANT
GOWN STRL REUS W/ TWL LRG LVL3 (GOWN DISPOSABLE) ×1 IMPLANT
HOLDER FOLEY CATH W/STRAP (MISCELLANEOUS) ×1 IMPLANT
IMMOBILIZER KNEE 20 (SOFTGOODS) ×1 IMPLANT
IMMOBILIZER KNEE 20 THIGH 36 (SOFTGOODS) ×1 IMPLANT
KIT TURNOVER KIT A (KITS) ×1 IMPLANT
MANIFOLD NEPTUNE II (INSTRUMENTS) ×1 IMPLANT
NS IRRIG 1000ML POUR BTL (IV SOLUTION) ×1 IMPLANT
PACK TOTAL KNEE CUSTOM (KITS) ×1 IMPLANT
PADDING CAST COTTON 6X4 STRL (CAST SUPPLIES) ×2 IMPLANT
PATELLA MEDIAL ATTUN 35MM KNEE (Knees) IMPLANT
PIN STEINMAN FIXATION KNEE (PIN) IMPLANT
PROTECTOR NERVE ULNAR (MISCELLANEOUS) ×1 IMPLANT
SET HNDPC FAN SPRY TIP SCT (DISPOSABLE) ×1 IMPLANT
SUT MNCRL AB 4-0 PS2 18 (SUTURE) ×1 IMPLANT
SUT VIC AB 2-0 CT1 TAPERPNT 27 (SUTURE) ×3 IMPLANT
SUTURE STRATFX 0 PDS 27 VIOLET (SUTURE) ×1 IMPLANT
TOWEL GREEN STERILE FF (TOWEL DISPOSABLE) ×1 IMPLANT
TRAY FOLEY MTR SLVR 16FR STAT (SET/KITS/TRAYS/PACK) ×1 IMPLANT
TUBE SUCTION HIGH CAP CLEAR NV (SUCTIONS) ×1 IMPLANT
WATER STERILE IRR 1000ML POUR (IV SOLUTION) ×2 IMPLANT
WRAP KNEE MAXI GEL POST OP (GAUZE/BANDAGES/DRESSINGS) ×1 IMPLANT

## 2023-06-12 NOTE — Plan of Care (Signed)
  Problem: Education: Goal: Knowledge of General Education information will improve Description: Including pain rating scale, medication(s)/side effects and non-pharmacologic comfort measures Outcome: Progressing   Problem: Health Behavior/Discharge Planning: Goal: Ability to manage health-related needs will improve Outcome: Progressing   Problem: Clinical Measurements: Goal: Ability to maintain clinical measurements within normal limits will improve Outcome: Progressing Goal: Will remain free from infection Outcome: Progressing Goal: Diagnostic test results will improve Outcome: Progressing Goal: Respiratory complications will improve Outcome: Progressing Goal: Cardiovascular complication will be avoided Outcome: Progressing   Problem: Activity: Goal: Risk for activity intolerance will decrease Outcome: Adequate for Discharge   Problem: Nutrition: Goal: Adequate nutrition will be maintained Outcome: Completed/Met   Problem: Coping: Goal: Level of anxiety will decrease Outcome: Progressing   Problem: Elimination: Goal: Will not experience complications related to bowel motility Outcome: Progressing Goal: Will not experience complications related to urinary retention Outcome: Progressing   Problem: Pain Managment: Goal: General experience of comfort will improve and/or be controlled Outcome: Progressing   Problem: Safety: Goal: Ability to remain free from injury will improve Outcome: Progressing   Problem: Skin Integrity: Goal: Risk for impaired skin integrity will decrease Outcome: Progressing   Problem: Education: Goal: Knowledge of the prescribed therapeutic regimen will improve Outcome: Progressing Goal: Individualized Educational Video(s) Outcome: Completed/Met   Problem: Activity: Goal: Ability to avoid complications of mobility impairment will improve Outcome: Adequate for Discharge Goal: Range of joint motion will improve Outcome: Adequate for  Discharge   Problem: Clinical Measurements: Goal: Postoperative complications will be avoided or minimized Outcome: Progressing   Problem: Pain Management: Goal: Pain level will decrease with appropriate interventions Outcome: Progressing   Problem: Skin Integrity: Goal: Will show signs of wound healing Outcome: Progressing

## 2023-06-12 NOTE — Interval H&P Note (Signed)
 History and Physical Interval Note:  06/12/2023 6:37 AM  Hailey Bates  has presented today for surgery, with the diagnosis of Left knee ostearthritis.  The various methods of treatment have been discussed with the patient and family. After consideration of risks, benefits and other options for treatment, the patient has consented to  Procedure(s): ARTHROPLASTY, KNEE, TOTAL (Left) as a surgical intervention.  The patient's history has been reviewed, patient examined, no change in status, stable for surgery.  I have reviewed the patient's chart and labs.  Questions were answered to the patient's satisfaction.     Samuel Crock Leotis Isham

## 2023-06-12 NOTE — Op Note (Signed)
 OPERATIVE REPORT-TOTAL KNEE ARTHROPLASTY   Pre-operative diagnosis- Osteoarthritis  Left knee(s)  Post-operative diagnosis- Osteoarthritis Left knee(s)  Procedure-  Left  Total Knee Arthroplasty  Surgeon- Henri Loft. Harneet Noblett, MD  Assistant- Angelo Kennedy, PA-C   Anesthesia-  Adductor canal block and spinal  EBL- 25 ml   Drains None  Tourniquet time-  Total Tourniquet Time Documented: Thigh (Left) - 33 minutes Total: Thigh (Left) - 33 minutes     Complications- None  Condition-PACU - hemodynamically stable.   Brief Clinical Note  Hailey Bates is a 68 y.o. year old female with end stage OA of her left knee with progressively worsening pain and dysfunction. She has constant pain, with activity and at rest and significant functional deficits with difficulties even with ADLs. She has had extensive non-op management including analgesics, injections of cortisone and viscosupplements, and home exercise program, but remains in significant pain with significant dysfunction. Radiographs show bone on bone arthritis patellofemoral. She presents now for left Total Knee Arthroplasty.     Procedure in detail---   The patient is brought into the operating room and positioned supine on the operating table. After successful administration of  Adductor canal block and spinal,   a tourniquet is placed high on the  Left thigh(s) and the lower extremity is prepped and draped in the usual sterile fashion. Time out is performed by the operating team and then the  Left lower extremity is wrapped in Esmarch, knee flexed and the tourniquet inflated to 300 mmHg.       A midline incision is made with a ten blade through the subcutaneous tissue to the level of the extensor mechanism. A fresh blade is used to make a medial parapatellar arthrotomy. Soft tissue over the proximal medial tibia is subperiosteally elevated to the joint line with a knife and into the semimembranosus bursa with a Cobb elevator.  Soft tissue over the proximal lateral tibia is elevated with attention being paid to avoiding the patellar tendon on the tibial tubercle. The patella is everted, knee flexed 90 degrees and the ACL and PCL are removed. Findings are bone on bone lateral and patellofemoral with large global osteophytes        The drill is used to create a starting hole in the distal femur and the canal is thoroughly irrigated with sterile saline to remove the fatty contents. The 5 degree Left  valgus alignment guide is placed into the femoral canal and the distal femoral cutting block is pinned to remove 10 mm off the distal femur. Resection is made with an oscillating saw.      The tibia is subluxed forward and the menisci are removed. The extramedullary alignment guide is placed referencing proximally at the medial aspect of the tibial tubercle and distally along the second metatarsal axis and tibial crest. The block is pinned to remove 2mm off the more deficient lateral  side. Resection is made with an oscillating saw. Size 4is the most appropriate size for the tibia and the proximal tibia is prepared with the modular drill and keel punch for that size.      The femoral sizing guide is placed and size 5 is most appropriate. Rotation is marked off the epicondylar axis and confirmed by creating a rectangular flexion gap at 90 degrees. The size 5 cutting block is pinned in this rotation and the anterior, posterior and chamfer cuts are made with the oscillating saw. The intercondylar block is then placed and that cut is made.  Trial size 4 tibial component, trial size 5 narrow posterior stabilized femur and a 8  mm posterior stabilized rotating platform insert trial is placed. Full extension is achieved with excellent varus/valgus and anterior/posterior balance throughout full range of motion. The patella is everted and thickness measured to be 22  mm. Free hand resection is taken to 12 mm, a 35 template is placed, lug holes are  drilled, trial patella is placed, and it tracks normally. Osteophytes are removed off the posterior femur with the trial in place. All trials are removed and the cut bone surfaces prepared with pulsatile lavage. Cement is mixed and once ready for implantation, the size 4 tibial implant, size  5 narrow posterior stabilized femoral component, and the size 35 patella are cemented in place and the patella is held with the clamp. The trial insert is placed and the knee held in full extension. The Exparel  (20 ml mixed with 60 ml saline) is injected into the extensor mechanism, posterior capsule, medial and lateral gutters and subcutaneous tissues.  All extruded cement is removed and once the cement is hard the permanent 8 mm posterior stabilized rotating platform insert is placed into the tibial tray.      The wound is copiously irrigated with saline solution and the extensor mechanism closed with # 0 Stratofix suture. The tourniquet is released for a total tourniquet time of 33  minutes. Flexion against gravity is 140 degrees and the patella tracks normally. Subcutaneous tissue is closed with 2.0 vicryl and subcuticular with running 4.0 Monocryl. The incision is cleaned and dried and steri-strips and a bulky sterile dressing are applied. The limb is placed into a knee immobilizer and the patient is awakened and transported to recovery in stable condition.      Please note that a surgical assistant was a medical necessity for this procedure in order to perform it in a safe and expeditious manner. Surgical assistant was necessary to retract the ligaments and vital neurovascular structures to prevent injury to them and also necessary for proper positioning of the limb to allow for anatomic placement of the prosthesis.   Henri Loft Caster [place], MD    06/12/2023, 8:23 AM

## 2023-06-12 NOTE — Evaluation (Signed)
 Physical Therapy Evaluation Patient Details Name: Hailey Bates MRN: [id number] DOB: [birth year] Today's Date: 06/12/2023  History of Present Illness  68 y.o. female admitted 06/12/23 for L TKA. PMH: R TKA 2023, chronic pain, inflammatory neuropathy, anxiety, HTN, R radial nerve palsy.  Clinical Impression  Pt is s/p TKA resulting in the deficits listed below (see PT Problem List). Pt ambulated 40' with RW, no loss of balance. Initiated TKA HEP. Good progress expected.  Pt will benefit from acute skilled PT to increase their independence and safety with mobility to allow discharge.          If plan is discharge home, recommend the following: A little help with walking and/or transfers;A little help with bathing/dressing/bathroom;Assistance with cooking/housework;Assist for transportation;Help with stairs or ramp for entrance   Can travel by private vehicle        Equipment Recommendations None recommended by PT  Recommendations for Other Services       Functional Status Assessment Patient has had a recent decline in their functional status and demonstrates the ability to make significant improvements in function in a reasonable and predictable amount of time.     Precautions / Restrictions Precautions Precautions: Knee Restrictions Weight Bearing Restrictions Per Provider Order: No Other Position/Activity Restrictions: WBAT      Mobility  Bed Mobility Overal bed mobility: Modified Independent             General bed mobility comments: HOB up, used rail    Transfers Overall transfer level: Needs assistance Equipment used: Rolling walker (2 wheels) Transfers: Sit to/from Stand Sit to Stand: Contact guard assist           General transfer comment: VCs hand placement    Ambulation/Gait Ambulation/Gait assistance: Contact guard assist Gait Distance (Feet): 40 Feet Assistive device: Rolling walker (2 wheels) Gait Pattern/deviations: Step-to pattern, Decreased  step length - right, Decreased step length - left Gait velocity: decr     General Gait Details: steady with RW, no loss of balance  Stairs            Wheelchair Mobility     Tilt Bed    Modified Rankin (Stroke Patients Only)       Balance Overall balance assessment: Modified Independent                                           Pertinent Vitals/Pain Pain Assessment Pain Assessment: 0-10 Pain Score: 3  Pain Location: L knee Pain Descriptors / Indicators: Sore Pain Intervention(s): Limited activity within patient's tolerance, Monitored during session, Premedicated before session, Ice applied, Repositioned    Home Living Family/patient expects to be discharged to:: Private residence Living Arrangements: Alone Available Help at Discharge: Family;Available 24 hours/day Type of Home: House Home Access: Stairs to enter Entrance Stairs-Rails: Lawyer of Steps: 3   Home Layout: One level Home Equipment: Cane - single Librarian, academic (2 wheels) Additional Comments: daughter to stay with pt    Prior Function Prior Level of Function : Independent/Modified Independent             Mobility Comments: walks with SPC, no falls in past 6 months ADLs Comments: independent     Extremity/Trunk Assessment   Upper Extremity Assessment Upper Extremity Assessment: Overall WFL for tasks assessed    Lower Extremity Assessment Lower Extremity Assessment: LLE deficits/detail LLE Deficits / Details: AAROM L  knee ~5-60*, knee ext at least 3/5 LLE Sensation: WNL    Cervical / Trunk Assessment Cervical / Trunk Assessment: Normal  Communication   Communication Communication: No apparent difficulties    Cognition Arousal: Alert Behavior During Therapy: WFL for tasks assessed/performed   PT - Cognitive impairments: No apparent impairments                         Following commands: Intact       Cueing  Cueing Techniques: Verbal cues, Tactile cues     General Comments      Exercises Total Joint Exercises Ankle Circles/Pumps: AROM, Both, 10 reps, Supine Heel Slides: AAROM, Left, Supine   Assessment/Plan    PT Assessment Patient needs continued PT services  PT Problem List Decreased strength;Decreased range of motion;Decreased activity tolerance;Decreased balance;Decreased mobility;Pain       PT Treatment Interventions Gait training;Therapeutic exercise;Therapeutic activities;Patient/family education;Stair training    PT Goals (Current goals can be found in the Care Plan section)  Acute Rehab PT Goals Patient Stated Goal: walk her 2 dogs PT Goal Formulation: With patient Time For Goal Achievement: 06/19/23 Potential to Achieve Goals: Good    Frequency 7X/week     Co-evaluation               AM-PAC PT "6 Clicks" Mobility  Outcome Measure Help needed turning from your back to your side while in a flat bed without using bedrails?: A Little Help needed moving from lying on your back to sitting on the side of a flat bed without using bedrails?: A Little Help needed moving to and from a bed to a chair (including a wheelchair)?: A Little Help needed standing up from a chair using your arms (e.g., wheelchair or bedside chair)?: A Little Help needed to walk in hospital room?: A Little Help needed climbing 3-5 steps with a railing? : A Lot 6 Click Score: 17    End of Session Equipment Utilized During Treatment: Gait belt Activity Tolerance: Patient tolerated treatment well Patient left: in chair;with chair alarm set;with call bell/phone within reach   PT Visit Diagnosis: Difficulty in walking, not elsewhere classified (R26.2);Pain Pain - Right/Left: Left Pain - part of body: Knee    Time: 1914-7829 PT Time Calculation (min) (ACUTE ONLY): 21 min   Charges:   PT Evaluation $PT Eval Moderate Complexity: 1 Mod   PT General Charges $$ ACUTE PT VISIT: 1 Visit         Daymon Evans PT 06/12/2023  Acute Rehabilitation Services  Office [phone]

## 2023-06-12 NOTE — Discharge Instructions (Signed)
 Ollen Gross, MD Total Joint Specialist [facility] [place] [address] [place], [place] [address] [phone]  TOTAL KNEE REPLACEMENT POSTOPERATIVE DIRECTIONS    Knee Rehabilitation, Guidelines Following Surgery  Results after knee surgery are often greatly improved when you follow the exercise, range of motion and muscle strengthening exercises prescribed by your doctor. Safety measures are also important to protect the knee from further injury. If any of these exercises cause you to have increased pain or swelling in your knee joint, decrease the amount until you are comfortable again and slowly increase them. If you have problems or questions, call your caregiver or physical therapist for advice.   BLOOD CLOT PREVENTION Take an 81 mg Aspirin two times a day for three weeks following surgery. Then take an 81 mg Aspirin once a day for three weeks. Then discontinue Aspirin. You may resume your vitamins/supplements upon discharge from the hospital. Do not take any NSAIDs (Advil, Aleve, Ibuprofen, Meloxicam, etc.) until you have discontinued the 81 mg Aspirin twice a day.  HOME CARE INSTRUCTIONS  Remove items at home which could result in a fall. This includes throw rugs or furniture in walking pathways.  ICE to the affected knee as much as tolerated. Icing helps control swelling. If the swelling is well controlled you will be more comfortable and rehab easier. Continue to use ice on the knee for pain and swelling from surgery. You may notice swelling that will progress down to the foot and ankle. This is normal after surgery. Elevate the leg when you are not up walking on it.    Continue to use the breathing machine which will help keep your temperature down. It is common for your temperature to cycle up and down following surgery, especially at night when you are not up moving around and exerting yourself. The breathing machine keeps your lungs expanded and your  temperature down. Do not place pillow under the operative knee, focus on keeping the knee straight while resting  DIET You may resume your previous home diet once you are discharged from the hospital.  DRESSING / WOUND CARE / SHOWERING Keep your bulky bandage on for 2 days. On the third post-operative day you may remove the Ace bandage and gauze. There is a waterproof adhesive bandage on your skin which will stay in place until your first follow-up appointment. Once you remove this you will not need to place another bandage You may begin showering 3 days following surgery, but do not submerge the incision under water.  ACTIVITY For the first 5 days, the key is rest and control of pain and swelling Do your home exercises twice a day starting on post-operative day 3. On the days you go to physical therapy, just do the home exercises once that day. You should rest, ice and elevate the leg for 50 minutes out of every hour. Get up and walk/stretch for 10 minutes per hour. After 5 days you can increase your activity slowly as tolerated. Walk with your walker as instructed. Use the walker until you are comfortable transitioning to a cane. Walk with the cane in the opposite hand of the operative leg. You may discontinue the cane once you are comfortable and walking steadily. Avoid periods of inactivity such as sitting longer than an hour when not asleep. This helps prevent blood clots.  You may discontinue the knee immobilizer once you are able to perform a straight leg raise while lying down. You may resume a sexual relationship  in one month or when given the OK by your doctor.  You may return to work once you are cleared by your doctor.  Do not drive a car for 6 weeks or until released by your surgeon.  Do not drive while taking narcotics.  TED HOSE STOCKINGS Wear the elastic stockings on both legs for three weeks following surgery during the day. You may remove them at night for sleeping.  WEIGHT  BEARING Weight bearing as tolerated with assist device (walker, cane, etc) as directed, use it as long as suggested by your surgeon or therapist, typically at least 4-6 weeks.  POSTOPERATIVE CONSTIPATION PROTOCOL Constipation - defined medically as fewer than three stools per week and severe constipation as less than one stool per week.  One of the most common issues patients have following surgery is constipation.  Even if you have a regular bowel pattern at home, your normal regimen is likely to be disrupted due to multiple reasons following surgery.  Combination of anesthesia, postoperative narcotics, change in appetite and fluid intake all can affect your bowels.  In order to avoid complications following surgery, here are some recommendations in order to help you during your recovery period.  Colace (docusate) - Pick up an over-the-counter form of Colace or another stool softener and take twice a day as long as you are requiring postoperative pain medications.  Take with a full glass of water daily.  If you experience loose stools or diarrhea, hold the colace until you stool forms back up. If your symptoms do not get better within 1 week or if they get worse, check with your doctor. Dulcolax (bisacodyl) - Pick up over-the-counter and take as directed by the product packaging as needed to assist with the movement of your bowels.  Take with a full glass of water.  Use this product as needed if not relieved by Colace only.  MiraLax (polyethylene glycol) - Pick up over-the-counter to have on hand. MiraLax is a solution that will increase the amount of water in your bowels to assist with bowel movements.  Take as directed and can mix with a glass of water, juice, soda, coffee, or tea. Take if you go more than two days without a movement. Do not use MiraLax more than once per day. Call your doctor if you are still constipated or irregular after using this medication for 7 days in a row.  If you continue  to have problems with postoperative constipation, please contact the office for further assistance and recommendations.  If you experience "the worst abdominal pain ever" or develop nausea or vomiting, please contact the office immediatly for further recommendations for treatment.  ITCHING If you experience itching with your medications, try taking only a single pain pill, or even half a pain pill at a time.  You can also use Benadryl over the counter for itching or also to help with sleep.   MEDICATIONS See your medication summary on the "After Visit Summary" that the nursing staff will review with you prior to discharge.  You may have some home medications which will be placed on hold until you complete the course of blood thinner medication.  It is important for you to complete the blood thinner medication as prescribed by your surgeon.  Continue your approved medications as instructed at time of discharge.  PRECAUTIONS If you experience chest pain or shortness of breath - call 911 immediately for transfer to the hospital emergency department.  If you develop a fever greater  that 101 F, purulent drainage from wound, increased redness or drainage from wound, foul odor from the wound/dressing, or calf pain - CONTACT YOUR SURGEON.                                                   FOLLOW-UP APPOINTMENTS Make sure you keep all of your appointments after your operation with your surgeon and caregivers. You should call the office at the above phone number and make an appointment for approximately two weeks after the date of your surgery or on the date instructed by your surgeon outlined in the "After Visit Summary".  RANGE OF MOTION AND STRENGTHENING EXERCISES  Rehabilitation of the knee is important following a knee injury or an operation. After just a few days of immobilization, the muscles of the thigh which control the knee become weakened and shrink (atrophy). Knee exercises are designed to build up  the tone and strength of the thigh muscles and to improve knee motion. Often times heat used for twenty to thirty minutes before working out will loosen up your tissues and help with improving the range of motion but do not use heat for the first two weeks following surgery. These exercises can be done on a training (exercise) mat, on the floor, on a table or on a bed. Use what ever works the best and is most comfortable for you Knee exercises include:  Leg Lifts - While your knee is still immobilized in a splint or cast, you can do straight leg raises. Lift the leg to 60 degrees, hold for 3 sec, and slowly lower the leg. Repeat 10-20 times 2-3 times daily. Perform this exercise against resistance later as your knee gets better.  Quad and Hamstring Sets - Tighten up the muscle on the front of the thigh (Quad) and hold for 5-10 sec. Repeat this 10-20 times hourly. Hamstring sets are done by pushing the foot backward against an object and holding for 5-10 sec. Repeat as with quad sets.  Leg Slides: Lying on your back, slowly slide your foot toward your buttocks, bending your knee up off the floor (only go as far as is comfortable). Then slowly slide your foot back down until your leg is flat on the floor again. Angel Wings: Lying on your back spread your legs to the side as far apart as you can without causing discomfort.  A rehabilitation program following serious knee injuries can speed recovery and prevent re-injury in the future due to weakened muscles. Contact your doctor or a physical therapist for more information on knee rehabilitation.   POST-OPERATIVE OPIOID TAPER INSTRUCTIONS: It is important to wean off of your opioid medication as soon as possible. If you do not need pain medication after your surgery it is ok to stop day one. Opioids include: Codeine, Hydrocodone(Norco, Vicodin), Oxycodone(Percocet, oxycontin) and hydromorphone amongst others.  Long term and even short term use of opiods can  cause: Increased pain response Dependence Constipation Depression Respiratory depression And more.  Withdrawal symptoms can include Flu like symptoms Nausea, vomiting And more Techniques to manage these symptoms Hydrate well Eat regular healthy meals Stay active Use relaxation techniques(deep breathing, meditating, yoga) Do Not substitute Alcohol to help with tapering If you have been on opioids for less than two weeks and do not have pain than it is ok to stop all  together.  Plan to wean off of opioids This plan should start within one week post op of your joint replacement. Maintain the same interval or time between taking each dose and first decrease the dose.  Cut the total daily intake of opioids by one tablet each day Next start to increase the time between doses. The last dose that should be eliminated is the evening dose.   IF YOU ARE TRANSFERRED TO A SKILLED REHAB FACILITY If the patient is transferred to a skilled rehab facility following release from the hospital, a list of the current medications will be sent to the facility for the patient to continue.  When discharged from the skilled rehab facility, please have the facility set up the patient's Home Health Physical Therapy prior to being released. Also, the skilled facility will be responsible for providing the patient with their medications at time of release from the facility to include their pain medication, the muscle relaxants, and their blood thinner medication. If the patient is still at the rehab facility at time of the two week follow up appointment, the skilled rehab facility will also need to assist the patient in arranging follow up appointment in our office and any transportation needs.  MAKE SURE YOU:  Understand these instructions.  Get help right away if you are not doing well or get worse.   DENTAL ANTIBIOTICS:  In most cases prophylactic antibiotics for Dental procdeures after total joint surgery are  not necessary.  Exceptions are as follows:  1. History of prior total joint infection  2. Severely immunocompromised (Organ Transplant, cancer chemotherapy, Rheumatoid biologic medications such as Humera)  3. Poorly controlled diabetes (A1C &gt; 8.0, blood glucose over 200)  If you have one of these conditions, contact your surgeon for an antibiotic prescription, prior to your dental procedure.    Pick up stool softner and laxative for home use following surgery while on pain medications. Do not submerge incision under water. Please use good hand washing techniques while changing dressing each day. May shower starting three days after surgery. Please use a clean towel to pat the incision dry following showers. Continue to use ice for pain and swelling after surgery. Do not use any lotions or creams on the incision until instructed by your surgeon.

## 2023-06-12 NOTE — Progress Notes (Signed)
 Orthopedic Tech Progress Note Patient Details:  Hailey Bates [birth year] [id number]  CPM Left Knee CPM Left Knee: On Left Knee Flexion (Degrees): 40 Left Knee Extension (Degrees): 10  Post Interventions Patient Tolerated: Well  Toi Foster 06/12/2023, 9:08 AM

## 2023-06-12 NOTE — Progress Notes (Signed)
 Orthopedic Tech Progress Note Patient Details:  Hailey Bates September 26, 1955 [id number]  CPM Left Knee CPM Left Knee: Off Left Knee Flexion (Degrees): 40 Left Knee Extension (Degrees): 10  Post Interventions Patient Tolerated: Well Arrived to remove patients CPM and found machine had been paused at 36 degrees. Per patient she has not felt the machine moving since she left PACU and arrived to 3W. CPM removed. Hailey Bates 06/12/2023, 1:11 PM

## 2023-06-12 NOTE — Anesthesia Procedure Notes (Signed)
 Procedure Name: MAC Date/Time: 06/12/2023 7:27 AM  Performed by: Norvell Beers, CRNAPre-anesthesia Checklist: Patient identified, Emergency Drugs available, Suction available and Patient being monitored Patient Re-evaluated:Patient Re-evaluated prior to induction Oxygen Delivery Method: Simple face mask Preoxygenation: Pre-oxygenation with 100% oxygen Induction Type: IV induction Placement Confirmation: positive ETCO2

## 2023-06-12 NOTE — Anesthesia Postprocedure Evaluation (Signed)
 Anesthesia Post Note  Patient: Hailey Bates  Procedure(s) Performed: ARTHROPLASTY, KNEE, TOTAL (Left: Knee)     Patient location during evaluation: PACU Anesthesia Type: Spinal Level of consciousness: oriented and awake and alert Pain management: pain level controlled Vital Signs Assessment: post-procedure vital signs reviewed and stable Respiratory status: spontaneous breathing, respiratory function stable and nonlabored ventilation Cardiovascular status: blood pressure returned to baseline and stable Postop Assessment: no headache, no backache, no apparent nausea or vomiting and spinal receding Anesthetic complications: no   No notable events documented.  Last Vitals:  Vitals:   06/12/23 0855 06/12/23 0900  BP: 128/73 105/69  Pulse: 65 72  Resp: 13 (!) 8  Temp:    SpO2: 100% 99%    Last Pain:  Vitals:   06/12/23 0900  TempSrc:   PainSc: 0-No pain                 Lorre Opdahl A.

## 2023-06-12 NOTE — Anesthesia Procedure Notes (Signed)
 Anesthesia Regional Block: Adductor canal block   Pre-Anesthetic Checklist: , timeout performed,  Correct Patient, Correct Site, Correct Laterality,  Correct Procedure, Correct Position, site marked,  Risks and benefits discussed,  Surgical consent,  Pre-op evaluation,  At surgeon's request and post-op pain management  Laterality: Left  Prep: chloraprep       Needles:  Injection technique: Single-shot  Needle Type: Echogenic Stimulator Needle     Needle Length: 10cm  Needle Gauge: 21   Needle insertion depth: 7 cm   Additional Needles:   Procedures:,,,, ultrasound used (permanent image in chart),,    Narrative:  Start time: 06/12/2023 7:02 AM End time: 06/12/2023 7:07 AM Injection made incrementally with aspirations every 5 mL.  Performed by: Personally  Anesthesiologist: Tura Gaines, MD  Additional Notes: Timeout performed. Patient sedated. Relevant anatomy ID'd using US . Incremental 2-5ml injection of LA with frequent aspiration. Patient tolerated procedure well.

## 2023-06-12 NOTE — Transfer of Care (Addendum)
 Immediate Anesthesia Transfer of Care Note  Patient: Hailey Bates  Procedure(s) Performed: ARTHROPLASTY, KNEE, TOTAL (Left: Knee)  Patient Location: PACU  Anesthesia Type:General  Level of Consciousness: awake, alert , and oriented  Airway & Oxygen Therapy: Patient Spontanous Breathing and Patient connected to face mask oxygen  Post-op Assessment: Report given to RN and Post -op Vital signs reviewed and stable  Post vital signs: Hypotensive in PACU, treated with 120mcg phenylephrine . BP recovered with dose of phenylephrine . Patient place in Trendelenburg. Phenylephrine  syringe left with PACU nurse. Dr. Yvonnie Heritage notified.  Last Vitals:  Vitals Value Taken Time  BP 119/84 06/12/23 0852  Temp    Pulse 58 06/12/23 0854  Resp 18 06/12/23 0854  SpO2 100 % 06/12/23 0854  Vitals shown include unfiled device data.  Last Pain:  Vitals:   06/12/23 0549  TempSrc:   PainSc: 0-No pain         Complications: No notable events documented.

## 2023-06-13 ENCOUNTER — Encounter (HOSPITAL_COMMUNITY): Payer: Self-pay | Admitting: Orthopedic Surgery

## 2023-06-13 DIAGNOSIS — M1712 Unilateral primary osteoarthritis, left knee: Secondary | ICD-10-CM | POA: Diagnosis not present

## 2023-06-13 LAB — CBC
HCT: 38 % (ref 36.0–46.0)
Hemoglobin: 11.9 g/dL — ABNORMAL LOW (ref 12.0–15.0)
MCH: 27.4 pg (ref 26.0–34.0)
MCHC: 31.3 g/dL (ref 30.0–36.0)
MCV: 87.6 fL (ref 80.0–100.0)
Platelets: 210 10*3/uL (ref 150–400)
RBC: 4.34 MIL/uL (ref 3.87–5.11)
RDW: 13.2 % (ref 11.5–15.5)
WBC: 14.8 10*3/uL — ABNORMAL HIGH (ref 4.0–10.5)
nRBC: 0 % (ref 0.0–0.2)

## 2023-06-13 LAB — BASIC METABOLIC PANEL WITH GFR
Anion gap: 8 (ref 5–15)
BUN: 20 mg/dL (ref 8–23)
CO2: 24 mmol/L (ref 22–32)
Calcium: 9.2 mg/dL (ref 8.9–10.3)
Chloride: 104 mmol/L (ref 98–111)
Creatinine, Ser: 0.93 mg/dL (ref 0.44–1.00)
GFR, Estimated: >60 mL/min (ref >60)
Glucose, Bld: 128 mg/dL — ABNORMAL HIGH (ref 70–99)
Potassium: 4 mmol/L (ref 3.5–5.1)
Sodium: 136 mmol/L (ref 135–145)

## 2023-06-13 MED ORDER — METHOCARBAMOL 500 MG PO TABS: 500.0000 mg | ORAL_TABLET | Freq: Four times a day (QID) | ORAL | 0 refills | Status: AC | PRN

## 2023-06-13 MED ORDER — HYDROMORPHONE HCL 2 MG PO TABS
2.0000 mg | ORAL_TABLET | Freq: Four times a day (QID) | ORAL | 0 refills | Status: AC | PRN
Start: 2023-06-13 — End: ?

## 2023-06-13 MED ORDER — ASPIRIN 81 MG PO CHEW: 81.0000 mg | CHEWABLE_TABLET | Freq: Two times a day (BID) | ORAL | 0 refills | Status: AC

## 2023-06-13 MED ORDER — ONDANSETRON HCL 4 MG PO TABS: 4.0000 mg | ORAL_TABLET | Freq: Four times a day (QID) | ORAL | 0 refills | Status: AC | PRN

## 2023-06-13 NOTE — Progress Notes (Signed)
 MEWS Progress Note  Patient Details Name: HAZLEY COGLIANESE MRN: [id number] DOB: 23-Apr-1955 Today's Date: 06/13/2023   MEWS Flowsheet Documentation:  Assess: MEWS Score Temp: 98.1 F (36.7 C) BP: (!) 152/92 MAP (mmHg): 109 Pulse Rate: (!) 101 ECG Heart Rate: 77 Resp: 18 Level of Consciousness: Alert SpO2: 100 % O2 Device: Room Air Patient Activity (if Appropriate): In bed O2 Flow Rate (L/min): 2 L/min Assess: MEWS Score MEWS Temp: 0 MEWS Systolic: 0 MEWS Pulse: 1 MEWS RR: 0 MEWS LOC: 0 MEWS Score: 1 MEWS Score Color: Green Assess: SIRS CRITERIA SIRS Temperature : 0 SIRS Respirations : 0 SIRS Pulse: 1 SIRS WBC: 0 SIRS Score Sum : 1 SIRS Temperature : 0 SIRS Pulse: 1 SIRS Respirations : 0 SIRS WBC: 0 SIRS Score Sum : 1 Assess: if the MEWS score is Yellow or Red Were vital signs accurate and taken at a resting state?: Yes Does the patient meet 2 or more of the SIRS criteria?: No MEWS guidelines implemented : Yes, yellow Treat MEWS Interventions: Considered administering scheduled or prn medications/treatments as ordered Take Vital Signs Increase Vital Sign Frequency : Yellow: Q2hr x1, continue Q4hrs until patient remains green for 12hrs Escalate MEWS: Escalate: Yellow: Discuss with charge nurse and consider notifying provider and/or RRT   HR elevated d/t increased pain. Pt states that she had called 45 minutes ago and no one came. The message had not been relayed to this RN. Pain meds given.  HR returned to acceptable range after pain meds.     Michell Ahumada 06/13/2023, 12:37 AM

## 2023-06-13 NOTE — Progress Notes (Signed)
 Physical Therapy Treatment Patient Details Name: Hailey Bates MRN: [id number] DOB: [birth year] Today's Date: 06/13/2023   History of Present Illness 68 y.o. female admitted 06/12/23 for L TKA. PMH: R TKA 2023, chronic pain, inflammatory neuropathy, anxiety, HTN, R radial nerve palsy.    PT Comments  Pt in bathroom when PT arrives, dons housecoat and able to initiate amb in hallway with RW. She requires sup A to amb x41ft and is able to negotiate 3 steps x2 with use of L HR and SPC in opposite UE. Pt requires sup A for stairs and cued for safe pattern using non reciprocal steps. Pt returns to recliner and completes HEP and handout. Pt dtr will be coming later and will be staying with her.     If plan is discharge home, recommend the following: A little help with walking and/or transfers;A little help with bathing/dressing/bathroom;Assistance with cooking/housework;Assist for transportation;Help with stairs or ramp for entrance   Can travel by private vehicle        Equipment Recommendations  None recommended by PT    Recommendations for Other Services       Precautions / Restrictions Precautions Precautions: Fall Restrictions Weight Bearing Restrictions Per Provider Order: Yes LLE Weight Bearing Per Provider Order: Weight bearing as tolerated     Mobility  Bed Mobility Overal bed mobility: Modified Independent             General bed mobility comments: pt in bathroom when PT arrives    Transfers Overall transfer level: Needs assistance Equipment used: Rolling walker (2 wheels) Transfers: Sit to/from Stand Sit to Stand: Contact guard assist                Ambulation/Gait Ambulation/Gait assistance: Supervision Gait Distance (Feet): 70 Feet Assistive device: Rolling walker (2 wheels) Gait Pattern/deviations: Step-to pattern, Decreased step length - right, Decreased step length - left, Antalgic, Wide base of support Gait velocity: decr     General Gait  Details: steady with RW, no loss of balance, good self correction with stride length   Stairs Stairs: Yes Stairs assistance: Supervision Stair Management: One rail Left, Step to pattern Number of Stairs: 3 General stair comments: educated on pattern, use of cane opposite hand rail   Wheelchair Mobility     Tilt Bed    Modified Rankin (Stroke Patients Only)       Balance Overall balance assessment: Needs assistance Sitting-balance support: No upper extremity supported, Feet supported Sitting balance-Leahy Scale: Good     Standing balance support: No upper extremity supported Standing balance-Leahy Scale: Fair                              Hotel manager: No apparent difficulties  Cognition Arousal: Alert Behavior During Therapy: WFL for tasks assessed/performed   PT - Cognitive impairments: No apparent impairments                         Following commands: Intact      Cueing Cueing Techniques: Verbal cues, Tactile cues, Gestural cues  Exercises Total Joint Exercises Ankle Circles/Pumps: AROM, Both, 10 reps, Supine Quad Sets: AROM, 10 reps, Left Heel Slides: AAROM, Left, 5 reps Hip ABduction/ADduction: AAROM, 5 reps, Left Straight Leg Raises: Left, AAROM, 5 reps Long Arc Quad: AROM, Left, 10 reps Knee Flexion: AROM, Left, 10 reps    General Comments        Pertinent  Vitals/Pain Pain Assessment Pain Assessment: Faces Faces Pain Scale: Hurts little more Pain Location: L knee Pain Descriptors / Indicators: Sore, Operative site guarding Pain Intervention(s): Limited activity within patient's tolerance, Monitored during session, Premedicated before session    Home Living                          Prior Function            PT Goals (current goals can now be found in the care plan section) Acute Rehab PT Goals Patient Stated Goal: walk her 2 dogs PT Goal Formulation: With patient Time For  Goal Achievement: 06/19/23 Potential to Achieve Goals: Good Progress towards PT goals: Progressing toward goals    Frequency    7X/week      PT Plan      Co-evaluation              AM-PAC PT "6 Clicks" Mobility   Outcome Measure  Help needed turning from your back to your side while in a flat bed without using bedrails?: None Help needed moving from lying on your back to sitting on the side of a flat bed without using bedrails?: None Help needed moving to and from a bed to a chair (including a wheelchair)?: A Little Help needed standing up from a chair using your arms (e.g., wheelchair or bedside chair)?: A Little Help needed to walk in hospital room?: A Little Help needed climbing 3-5 steps with a railing? : A Little 6 Click Score: 20    End of Session Equipment Utilized During Treatment: Gait belt Activity Tolerance: Patient tolerated treatment well Patient left: in chair;with chair alarm set;with call bell/phone within reach Nurse Communication: Mobility status PT Visit Diagnosis: Difficulty in walking, not elsewhere classified (R26.2);Pain Pain - Right/Left: Left Pain - part of body: Knee     Time: 1884-1660 PT Time Calculation (min) (ACUTE ONLY): 29 min  Charges:    $Gait Training: 8-22 mins $Therapeutic Exercise: 8-22 mins PT General Charges $$ ACUTE PT VISIT: 1 Visit                     Hailey Bates, PT Acute Rehabilitation Services Office: [phone] 06/13/2023    Hailey Bates 06/13/2023, 10:44 AM

## 2023-06-13 NOTE — TOC Transition Note (Signed)
 Transition of Care [facility]) - Discharge Note   Patient Details  Name: Hailey Bates MRN: [id number] Date of Birth: 1955-09-20  Transition of Care [facility]) CM/SW Contact:  Delilah Fend, LCSW Phone Number: 06/13/2023, 10:13 AM   Clinical Narrative:     Met with pt who confirms she has needed DME in the home.  OPPT already arranged with [facility].  No further TOC needs.  Final next level of care: OP Rehab Barriers to Discharge: No Barriers Identified   Patient Goals and CMS Choice Patient states their goals for this hospitalization and ongoing recovery are:: return home          Discharge Placement                       Discharge Plan and Services Additional resources added to the After Visit Summary for                  DME Arranged: N/A DME Agency: NA                  Social Drivers of Health (SDOH) Interventions SDOH Screenings   Food Insecurity: No Food Insecurity (06/12/2023)  Housing: Low Risk  (06/12/2023)  Transportation Needs: No Transportation Needs (06/12/2023)  Utilities: Not At Risk (06/12/2023)  Social Connections: Moderately Integrated (06/12/2023)  Tobacco Use: Low Risk  (06/12/2023)     Readmission Risk Interventions     No data to display

## 2023-06-13 NOTE — Care Management Obs Status (Signed)
 MEDICARE OBSERVATION STATUS NOTIFICATION   Patient Details  Name: Hailey Bates MRN: [id number] Date of Birth: 02-08-1955   Medicare Observation Status Notification Given:  Yes    Tessie Fila, RN 06/13/2023, 10:41 AM

## 2023-06-13 NOTE — Progress Notes (Signed)
   Subjective: 1 Day Post-Op Procedure(s) (LRB): ARTHROPLASTY, KNEE, TOTAL (Left) Patient reports pain as mild.   Patient seen in rounds by Dr. France Ina. Patient is well, and has had no acute complaints or problems No issues overnight. Denies chest pain, SOB, or calf pain. Foley catheter removed this AM.  We will continue therapy today, ambulated 40' yesterday.   Objective: Vital signs in last 24 hours: Temp:  [97.6 F (36.4 C)-98.2 F (36.8 C)] 97.9 F (36.6 C) (05/13 0612) Pulse Rate:  [72-111] 93 (05/13 0612) Resp:  [8-19] 18 (05/13 0612) BP: (105-152)/(69-92) 117/74 (05/13 0612) SpO2:  [94 %-100 %] 97 % (05/13 0612)  Intake/Output from previous day:  Intake/Output Summary (Last 24 hours) at 06/13/2023 0857 Last data filed at 06/13/2023 0612 Gross per 24 hour  Intake 1451.56 ml  Output 3575 ml  Net -2123.44 ml     Intake/Output this shift: No intake/output data recorded.  Labs: Recent Labs    06/13/23 0321  HGB 11.9*   Recent Labs    06/13/23 0321  WBC 14.8*  RBC 4.34  HCT 38.0  PLT 210   Recent Labs    06/13/23 0321  NA 136  K 4.0  CL 104  CO2 24  BUN 20  CREATININE 0.93  GLUCOSE 128*  CALCIUM 9.2   No results for input(s): "LABPT", "INR" in the last 72 hours.  Exam: General - Patient is Alert and Oriented Extremity - Neurologically intact Neurovascular intact Sensation intact distally Dorsiflexion/Plantar flexion intact Dressing - dressing C/D/I Motor Function - intact, moving foot and toes well on exam.   Past Medical History:  Diagnosis Date   Arthritis    Hypertension    Neuromuscular disorder (HCC)    sciatica   Pneumonia    walking pneumonia - 02/2023 no issues on 05/31/23   Situs inversus     Assessment/Plan: 1 Day Post-Op Procedure(s) (LRB): ARTHROPLASTY, KNEE, TOTAL (Left) Principal Problem:   OA (osteoarthritis) of knee Active Problems:   Osteoarthritis of left knee  Estimated body mass index is 34.81 kg/m as  calculated from the following:   Height as of this encounter: 5\' 4"  (1.626 m).   Weight as of this encounter: 92 kg. Advance diet Up with therapy D/C IV fluids   Patient's anticipated LOS is less than 2 midnights, meeting these requirements: - Lives within 1 hour of care - Has a competent adult at home to recover with post-op recover - NO history of  - Diabetes  - Coronary Artery Disease  - Heart failure  - Heart attack  - Stroke  - DVT/VTE  - Cardiac arrhythmia  - Respiratory Failure/COPD  - Renal failure  - Anemia  - Advanced Liver disease   DVT Prophylaxis - Aspirin  Weight bearing as tolerated. Continue therapy.  Plan is to go Home after hospital stay. Plan for discharge later today if progresses with therapy and meeting goals. Scheduled for OPPT at [facility]. Follow-up in the office in 2 weeks.  The PDMP database was reviewed today prior to any opioid medications being prescribed to this patient.  Sharlynn Dear, PA-C Orthopedic Surgery [phone] 06/13/2023, 8:57 AM

## 2023-06-16 NOTE — Discharge Summary (Signed)
 Patient ID: Hailey Bates MRN: [id number] DOB/AGE: April 20, 1955 68 y.o.  Admit date: 06/12/2023 Discharge date: 06/13/2023  Admission Diagnoses:  Principal Problem:   OA (osteoarthritis) of knee Active Problems:   Osteoarthritis of left knee   Discharge Diagnoses:  Same  Past Medical History:  Diagnosis Date   Arthritis    Hypertension    Neuromuscular disorder (HCC)    sciatica   Pneumonia    walking pneumonia - 02/2023 no issues on 05/31/23   Situs inversus     Surgeries: Procedure(s): ARTHROPLASTY, KNEE, TOTAL on 06/12/2023   Consultants:   Discharged Condition: Improved  Hospital Course: Hailey Bates is an 68 y.o. female who was admitted 06/12/2023 for operative treatment ofOA (osteoarthritis) of knee. Patient has severe unremitting pain that affects sleep, daily activities, and work/hobbies. After pre-op clearance the patient was taken to the operating room on 06/12/2023 and underwent  Procedure(s): ARTHROPLASTY, KNEE, TOTAL.    Patient was given perioperative antibiotics:  Anti-infectives (From admission, onward)    Start     Dose/Rate Route Frequency Ordered Stop   06/12/23 1400  ceFAZolin  (ANCEF ) IVPB 2g/100 mL premix        2 g 200 mL/hr over 30 Minutes Intravenous Every 6 hours 06/12/23 1117 06/12/23 2035   06/12/23 0600  ceFAZolin  (ANCEF ) IVPB 2g/100 mL premix        2 g 200 mL/hr over 30 Minutes Intravenous On call to O.R. 06/12/23 0532 06/12/23 8295        Patient was given sequential compression devices, early ambulation, and chemoprophylaxis to prevent DVT.  Patient benefited maximally from hospital stay and there were no complications.    Recent vital signs: No data found.   Recent laboratory studies: No results for input(s): "WBC", "HGB", "HCT", "PLT", "NA", "K", "CL", "CO2", "BUN", "CREATININE", "GLUCOSE", "INR", "CALCIUM" in the last 72 hours.  Invalid input(s): "PT", "2"   Discharge Medications:   Allergies as of 06/13/2023   No Known  Allergies      Medication List     STOP taking these medications    cyclobenzaprine  10 MG tablet Commonly known as: FLEXERIL    ibuprofen 200 MG tablet Commonly known as: ADVIL       TAKE these medications    amLODipine -olmesartan  5-20 MG tablet Commonly known as: AZOR  Take 1 tablet by mouth daily.   aspirin  81 MG chewable tablet Chew 1 tablet (81 mg total) by mouth 2 (two) times daily for 21 days. Then take one 81 mg aspirin  once a day for three weeks. Then discontinue aspirin .   escitalopram  10 MG tablet Commonly known as: LEXAPRO  Take 10 mg by mouth daily.   famotidine  20 MG tablet Commonly known as: PEPCID  Take 20 mg by mouth daily.   HYDROmorphone  2 MG tablet Commonly known as: DILAUDID  Take 1-2 tablets (2-4 mg total) by mouth every 6 (six) hours as needed for severe pain (pain score 7-10) (not responding to chronic oxycodone ).   methocarbamol 500 MG tablet Commonly known as: ROBAXIN Take 1 tablet (500 mg total) by mouth every 6 (six) hours as needed for muscle spasms.   ondansetron  4 MG tablet Commonly known as: ZOFRAN  Take 1 tablet (4 mg total) by mouth every 6 (six) hours as needed for nausea.   oxyCODONE -acetaminophen  10-325 MG tablet Commonly known as: PERCOCET Take 1 tablet by mouth every 4 (four) hours as needed for pain.   pregabalin 100 MG capsule Commonly known as: LYRICA Take 100 mg by mouth 2 (two)  times daily.   zolpidem  10 MG tablet Commonly known as: AMBIEN  Take 10 mg by mouth at bedtime.               Discharge Care Instructions  (From admission, onward)           Start     Ordered   06/13/23 0000  Weight bearing as tolerated        06/13/23 0900   06/13/23 0000  Change dressing       Comments: You may remove the bulky bandage (ACE wrap and gauze) two days after surgery. You will have an adhesive waterproof bandage underneath. Leave this in place until your first follow-up appointment.   06/13/23 0900             Diagnostic Studies: No results found.  Disposition: Discharge disposition: 01-Home or Self Care       Discharge Instructions     Call MD / Call 911   Complete by: As directed    If you experience chest pain or shortness of breath, CALL 911 and be transported to the hospital emergency room.  If you develope a fever above 101 F, pus (white drainage) or increased drainage or redness at the wound, or calf pain, call your surgeon's office.   Change dressing   Complete by: As directed    You may remove the bulky bandage (ACE wrap and gauze) two days after surgery. You will have an adhesive waterproof bandage underneath. Leave this in place until your first follow-up appointment.   Constipation Prevention   Complete by: As directed    Drink plenty of fluids.  Prune juice may be helpful.  You may use a stool softener, such as Colace (over the counter) 100 mg twice a day.  Use MiraLax  (over the counter) for constipation as needed.   Diet - low sodium heart healthy   Complete by: As directed    Do not put a pillow under the knee. Place it under the heel.   Complete by: As directed    Driving restrictions   Complete by: As directed    No driving for two weeks   Post-operative opioid taper instructions:   Complete by: As directed    POST-OPERATIVE OPIOID TAPER INSTRUCTIONS: It is important to wean off of your opioid medication as soon as possible. If you do not need pain medication after your surgery it is ok to stop day one. Opioids include: Codeine, Hydrocodone(Norco, Vicodin), Oxycodone (Percocet, oxycontin ) and hydromorphone  amongst others.  Long term and even short term use of opiods can cause: Increased pain response Dependence Constipation Depression Respiratory depression And more.  Withdrawal symptoms can include Flu like symptoms Nausea, vomiting And more Techniques to manage these symptoms Hydrate well Eat regular healthy meals Stay active Use relaxation  techniques(deep breathing, meditating, yoga) Do Not substitute Alcohol to help with tapering If you have been on opioids for less than two weeks and do not have pain than it is ok to stop all together.  Plan to wean off of opioids This plan should start within one week post op of your joint replacement. Maintain the same interval or time between taking each dose and first decrease the dose.  Cut the total daily intake of opioids by one tablet each day Next start to increase the time between doses. The last dose that should be eliminated is the evening dose.      TED hose   Complete by: As directed  Use stockings (TED hose) for three weeks on both leg(s).  You may remove them at night for sleeping.   Weight bearing as tolerated   Complete by: As directed         Follow-up Information     Aluisio, Samuel Crock, MD. Schedule an appointment as soon as possible for a visit in 2 week(s).   Specialty: Orthopedic Surgery Contact information: [address] [place] [address] [place] [address] [phone]                  Signed: Sharlynn Dear 06/16/2023, 9:36 AM
